# Patient Record
Sex: Female | Born: 1965 | Race: White | Hispanic: No | Marital: Married | Smoking: Former smoker
Health system: Southern US, Community
[De-identification: ages and names within clinical notes are randomized; demographics above are authoritative.]

## PROBLEM LIST (undated history)

## (undated) DIAGNOSIS — C4491 Basal cell carcinoma of skin, unspecified: Secondary | ICD-10-CM

## (undated) DIAGNOSIS — IMO0002 Reserved for concepts with insufficient information to code with codable children: Secondary | ICD-10-CM

## (undated) HISTORY — DX: Basal cell carcinoma of skin, unspecified: C44.91

## (undated) HISTORY — PX: KNEE SURGERY: SHX244

## (undated) HISTORY — PX: BASAL CELL CARCINOMA EXCISION: SHX1214

## (undated) HISTORY — PX: MOUTH SURGERY: SHX715

## (undated) HISTORY — DX: Reserved for concepts with insufficient information to code with codable children: IMO0002

---

## 1990-05-21 HISTORY — PX: DILATION AND CURETTAGE OF UTERUS: SHX78

## 1993-05-21 HISTORY — PX: DILATION AND CURETTAGE OF UTERUS: SHX78

## 2004-05-21 HISTORY — PX: REDUCTION MAMMAPLASTY: SUR839

## 2008-12-13 ENCOUNTER — Encounter: Admission: RE | Admit: 2008-12-13 | Discharge: 2008-12-13 | Payer: Self-pay | Admitting: Obstetrics and Gynecology

## 2009-12-14 ENCOUNTER — Encounter: Admission: RE | Admit: 2009-12-14 | Discharge: 2009-12-14 | Payer: Self-pay | Admitting: Obstetrics and Gynecology

## 2010-08-01 ENCOUNTER — Encounter: Payer: Self-pay | Admitting: Family Medicine

## 2010-08-01 ENCOUNTER — Ambulatory Visit (INDEPENDENT_AMBULATORY_CARE_PROVIDER_SITE_OTHER): Payer: BC Managed Care – PPO | Admitting: Family Medicine

## 2010-08-01 DIAGNOSIS — R269 Unspecified abnormalities of gait and mobility: Secondary | ICD-10-CM | POA: Insufficient documentation

## 2010-08-01 DIAGNOSIS — M25569 Pain in unspecified knee: Secondary | ICD-10-CM

## 2010-08-08 NOTE — Assessment & Plan Note (Signed)
SummaryErlene Cox [phone]   Vital Signs:  Patient profile:   45 year old female Height:      63 inches Weight:      125 pounds BMI:     22.22 BP sitting:   120 / 79  Vitals Entered By: Lillia Pauls CMA (August 01, 2010 3:27 PM)  CC:  L knee pain.  History of Present Illness: 45yo female to office c/o L knee pain x 4-days.  Started during run this weekend, denies any injury or trauma.  Did recently changes shoes the end of last week, also ran on pavement over the weekend & normally runs trails.  Pain along lateral aspect of knee & sharp in nature.  No associated swelling or mechanical symptoms.  No instability.  Worse going down hills.  Improved with rest.  Not taking any medications for the pain.  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  Currently training for 1/2 marathon scheduled for 09/16/10, running 20-35 miles/wk, typically running on trails.  Alternates between several different trails.  Had set of SuperFeet insoles in old running shoes, but no inserts in current shoes.  Review of Systems       per HPI, otherwise neg  Physical Exam  General:  Well-developed,well-nourished,in no acute distress; alert,appropriate and cooperative throughout examination Head:  normocephalic and atraumatic.   Eyes:  vision grossly intact.   Lungs:  normal respiratory effort.   Msk:  HIPS: FROM b/l without pain.  Normal hip strength in all planes.  KNEES: FROM b/l without pain.  No swelling or effusion.  No tenderness over joint lines, patella, PT, QT.  Slight TTP over ITB on left.  Neg Ober.  No ligamentous laxity.  Neg McMurray.  Neg patellar apprehension, neg patellar grind.  FEET/ANKLES: FROM ankles b/l without pain, weakness, or instability.  Callous noted along 2nd, 3rd, 4th MTs b/l.  Cavus foot while seated, longitudinal arch collapse with standing b/l, no transverse arch collapse.  GAIT: no leg length difference.  Over pronation b/l  with walking, more pronounced with running.  Also genu valgum while walking/running Pulses:  +2/4 DP & PT Neurologic:  sensation intact to light touch.     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  Try to run on flat/level surfaces - Reviewed ITB stretches - OTC NSAIDs as needed - f/u as needed  Orders: Garment,belt,sleeve or other covering ,elastic or similar stretch (U9811) Sports Insoles (B1478)  Problem # 2:  ABNORMALITY OF GAIT (ICD-781.2) - over prontation b/l - Fitted with sports insoles - these were comfortable to patient & gait more neutral.  Wear these in your running shoes  Orders: Sports Insoles (G9562)   Orders Added: 1)  New Patient Level II [99202] 2)  Garment,belt,sleeve or other covering ,elastic or similar stretch [A4466] 3)  Sports Insoles [L3510]

## 2010-08-09 ENCOUNTER — Other Ambulatory Visit (HOSPITAL_COMMUNITY)
Admission: RE | Admit: 2010-08-09 | Discharge: 2010-08-09 | Disposition: A | Discharge status: Home / Self Care | Payer: BC Managed Care – PPO | Source: Ambulatory Visit | Attending: Gynecology | Admitting: Gynecology

## 2010-08-09 ENCOUNTER — Other Ambulatory Visit: Payer: Self-pay | Admitting: Gynecology

## 2010-08-09 ENCOUNTER — Ambulatory Visit (INDEPENDENT_AMBULATORY_CARE_PROVIDER_SITE_OTHER): Payer: BC Managed Care – PPO | Admitting: Gynecology

## 2010-08-09 DIAGNOSIS — Z124 Encounter for screening for malignant neoplasm of cervix: Secondary | ICD-10-CM | POA: Insufficient documentation

## 2010-08-09 DIAGNOSIS — Z833 Family history of diabetes mellitus: Secondary | ICD-10-CM

## 2010-08-09 DIAGNOSIS — Z13220 Encounter for screening for lipoid disorders: Secondary | ICD-10-CM

## 2010-08-09 DIAGNOSIS — B373 Candidiasis of vulva and vagina: Secondary | ICD-10-CM

## 2010-08-09 DIAGNOSIS — Z01419 Encounter for gynecological examination (general) (routine) without abnormal findings: Secondary | ICD-10-CM

## 2010-09-04 ENCOUNTER — Other Ambulatory Visit: Payer: Self-pay | Admitting: Gynecology

## 2010-09-04 DIAGNOSIS — Z1231 Encounter for screening mammogram for malignant neoplasm of breast: Secondary | ICD-10-CM

## 2010-09-23 ENCOUNTER — Emergency Department (HOSPITAL_BASED_OUTPATIENT_CLINIC_OR_DEPARTMENT_OTHER)
Admission: EM | Admit: 2010-09-23 | Discharge: 2010-09-24 | Disposition: A | Discharge status: Home / Self Care | Payer: BC Managed Care – PPO | Attending: Emergency Medicine | Admitting: Emergency Medicine

## 2010-09-23 DIAGNOSIS — M7989 Other specified soft tissue disorders: Secondary | ICD-10-CM | POA: Insufficient documentation

## 2010-09-23 DIAGNOSIS — R209 Unspecified disturbances of skin sensation: Secondary | ICD-10-CM | POA: Insufficient documentation

## 2010-09-24 LAB — BASIC METABOLIC PANEL
BUN: 17 mg/dL (ref 6–23)
Creatinine, Ser: 0.7 mg/dL (ref 0.4–1.2)
Potassium: 3.4 mEq/L — ABNORMAL LOW (ref 3.5–5.1)

## 2010-09-24 LAB — CBC
HCT: 34.6 % — ABNORMAL LOW (ref 36.0–46.0)
MCV: 89.6 fL (ref 78.0–100.0)
RBC: 3.86 MIL/uL — ABNORMAL LOW (ref 3.87–5.11)
WBC: 6 10*3/uL (ref 4.0–10.5)

## 2010-09-24 LAB — DIFFERENTIAL
Basophils Absolute: 0 10*3/uL (ref 0.0–0.1)
Eosinophils Relative: 2 % (ref 0–5)
Monocytes Absolute: 0.8 10*3/uL (ref 0.1–1.0)
Neutro Abs: 2.5 10*3/uL (ref 1.7–7.7)
Neutrophils Relative %: 42 % — ABNORMAL LOW (ref 43–77)

## 2010-12-28 ENCOUNTER — Ambulatory Visit
Admission: RE | Admit: 2010-12-28 | Discharge: 2010-12-28 | Disposition: A | Discharge status: Home / Self Care | Payer: BC Managed Care – PPO | Source: Ambulatory Visit | Attending: Gynecology | Admitting: Gynecology

## 2010-12-28 DIAGNOSIS — Z1231 Encounter for screening mammogram for malignant neoplasm of breast: Secondary | ICD-10-CM

## 2011-08-09 ENCOUNTER — Encounter: Payer: Self-pay | Admitting: *Deleted

## 2011-08-10 ENCOUNTER — Encounter: Payer: BC Managed Care – PPO | Admitting: Gynecology

## 2011-10-18 ENCOUNTER — Ambulatory Visit (INDEPENDENT_AMBULATORY_CARE_PROVIDER_SITE_OTHER): Payer: BC Managed Care – PPO | Admitting: Gynecology

## 2011-10-18 ENCOUNTER — Encounter: Payer: Self-pay | Admitting: Gynecology

## 2011-10-18 VITALS — BP 102/70 | Ht 64.0 in | Wt 119.0 lb

## 2011-10-18 DIAGNOSIS — Z01419 Encounter for gynecological examination (general) (routine) without abnormal findings: Secondary | ICD-10-CM

## 2011-10-18 DIAGNOSIS — R634 Abnormal weight loss: Secondary | ICD-10-CM

## 2011-10-18 DIAGNOSIS — N898 Other specified noninflammatory disorders of vagina: Secondary | ICD-10-CM

## 2011-10-18 LAB — COMPREHENSIVE METABOLIC PANEL
ALT: 12 U/L (ref 0–35)
AST: 18 U/L (ref 0–37)
Calcium: 9.2 mg/dL (ref 8.4–10.5)
Chloride: 103 mEq/L (ref 96–112)
Creat: 0.76 mg/dL (ref 0.50–1.10)
Sodium: 138 mEq/L (ref 135–145)
Total Protein: 6.8 g/dL (ref 6.0–8.3)

## 2011-10-18 LAB — WET PREP FOR TRICH, YEAST, CLUE
Trich, Wet Prep: NONE SEEN
Yeast Wet Prep HPF POC: NONE SEEN

## 2011-10-18 MED ORDER — FLUCONAZOLE 200 MG PO TABS: 200.0000 mg | ORAL_TABLET | Freq: Every day | ORAL | Status: AC

## 2011-10-18 NOTE — Progress Notes (Signed)
Whitney Cox February 19, 1966 [id number]        46 y.o.  for annual exam.  Several issues noted below.  Past medical history,surgical history, medications, allergies, family history and social history were all reviewed and documented in the EPIC chart. ROS:  Was performed and pertinent positives and negatives are included in the history.  Exam: sherri chaparone present Filed Vitals:   10/18/11 1143  BP: 102/70   General appearance  Normal Skin grossly normal Head/Neck normal with no cervical or supraclavicular adenopathy thyroid normal Lungs  clear Cardiac RR, without RMG Abdominal  soft, nontender, without masses, organomegaly or hernia Breasts  examined lying and sitting without masses, retractions, discharge or axillary adenopathy.  Bilateral well-healed reduction scars noted Pelvic  Ext/BUS/vagina  normal slight white discharge, wet prep done  Cervix  normal   Uterus  introverted, normal size, shape and contour, midline and mobile nontender   Adnexa  Without masses or tenderness    Anus and perineum  normal   Rectovaginal  normal sphincter tone without palpated masses or tenderness.    Assessment/Plan:  46 y.o. female for annual exam, regular menses, vasectomy birth control.    1. Weight loss. Patient reports in the last 2 years weight loss from her normal 125 to her current 119. She actually had weighed more than this before moving and then lost to the 119. She does run a lot on a daily basis and has attributed her weight loss to this but is little concerned about her weight loss. She has no hair changes, skin changes, fatigue or other symptoms. Recently saw her primary who did a CBC which showed anemia at 11.8 hemoglobin which I asked her to start an iron supplement. Also a potassium level of 3.4. We'll go ahead and check comprehensive metabolic panel TSH, ANA and HIV. Assuming labs are normal she'll monitor her weight, if she continues to lose then she'll follow up with her primary  physician for further evaluation, if she stabilizes and will monitor. 2. Vaginal discharge. Recently treated by primary for yeast with Diflucan 150 mg. Still notes some itching. Exam shows slight white discharge. KOH wet prep unimpressive. We'll treat with Diflucan 200 daily x3 days as a partially treated yeast and see if this doesn't resolve her symptoms. She will call if it persists. 3. Mammography. Patients due for mammogram in August. She does have a family history of breast cancer which I reviewed last year with her. I again reoffered genetic counseling and if she wants to pursue it she'll let me know to consider genetic testing. I did recommend considering a 3-D mammography this August when she is due for her mammogram and she will schedule this with the breast center. SBE monthly reviewed. 4. Contraception. Patient's husband had a vasectomy. 5. Pap smear. Patient has no history of abnormal Pap smears with last normal 2012. I did not do a Pap smear this year and per current screening guidelineswill plan on a less frequent screening interval at 3-5 years. 6. Health maintenance. Will check above blood work. Lipid profile last year was normal and I did not repeat this. Assuming she continues well from a gynecologic standpoint and she will see me in a year, sooner as needed.     Dara Lords MD, 12:34 PM 10/18/2011

## 2011-10-18 NOTE — Patient Instructions (Signed)
Follow up for blood results. Otherwise follow up in one year for annual gynecologic exam. Weight loss continues notify me and we will have your primary physician to evaluate you. Schedule your 3-D mammogram in August when due. Let me know if there is any problems arranging this.

## 2011-10-19 LAB — URINALYSIS W MICROSCOPIC + REFLEX CULTURE
Bacteria, UA: NONE SEEN
Glucose, UA: NEGATIVE mg/dL
Hgb urine dipstick: NEGATIVE
Nitrite: NEGATIVE
Specific Gravity, Urine: 1.014 (ref 1.005–1.030)
Squamous Epithelial / LPF: NONE SEEN
Urobilinogen, UA: 0.2 mg/dL (ref 0.0–1.0)
pH: 7 (ref 5.0–8.0)

## 2011-10-19 LAB — ANA: Anti Nuclear Antibody(ANA): NEGATIVE

## 2011-10-23 ENCOUNTER — Emergency Department (HOSPITAL_BASED_OUTPATIENT_CLINIC_OR_DEPARTMENT_OTHER)
Admission: EM | Admit: 2011-10-23 | Discharge: 2011-10-24 | Disposition: A | Discharge status: Home / Self Care | Payer: BC Managed Care – PPO | Attending: Emergency Medicine | Admitting: Emergency Medicine

## 2011-10-23 ENCOUNTER — Encounter (HOSPITAL_BASED_OUTPATIENT_CLINIC_OR_DEPARTMENT_OTHER): Payer: Self-pay | Admitting: *Deleted

## 2011-10-23 DIAGNOSIS — R300 Dysuria: Secondary | ICD-10-CM | POA: Insufficient documentation

## 2011-10-23 DIAGNOSIS — N390 Urinary tract infection, site not specified: Secondary | ICD-10-CM

## 2011-10-23 DIAGNOSIS — Z87891 Personal history of nicotine dependence: Secondary | ICD-10-CM | POA: Insufficient documentation

## 2011-10-23 DIAGNOSIS — Z87440 Personal history of urinary (tract) infections: Secondary | ICD-10-CM | POA: Insufficient documentation

## 2011-10-23 LAB — PREGNANCY, URINE: Preg Test, Ur: NEGATIVE

## 2011-10-23 NOTE — ED Notes (Signed)
Pt c/o " uti" x 1 week

## 2011-10-24 ENCOUNTER — Other Ambulatory Visit: Payer: Self-pay | Admitting: Gynecology

## 2011-10-24 DIAGNOSIS — Z1231 Encounter for screening mammogram for malignant neoplasm of breast: Secondary | ICD-10-CM

## 2011-10-24 LAB — URINALYSIS, ROUTINE W REFLEX MICROSCOPIC
Bilirubin Urine: NEGATIVE
Ketones, ur: NEGATIVE mg/dL
Specific Gravity, Urine: 1.004 — ABNORMAL LOW (ref 1.005–1.030)

## 2011-10-24 MED ORDER — NITROFURANTOIN MONOHYD MACRO 100 MG PO CAPS
100.0000 mg | ORAL_CAPSULE | Freq: Once | ORAL | Status: AC
  Administered 2011-10-24: 100 mg via ORAL
  Filled 2011-10-24: qty 1

## 2011-10-24 MED ORDER — NITROFURANTOIN MONOHYD MACRO 100 MG PO CAPS: 100.0000 mg | ORAL_CAPSULE | Freq: Two times a day (BID) | ORAL | Status: AC

## 2011-10-24 MED ORDER — PHENAZOPYRIDINE HCL 100 MG PO TABS
95.0000 mg | ORAL_TABLET | Freq: Once | ORAL | Status: AC
  Administered 2011-10-24: 100 mg via ORAL
  Filled 2011-10-24: qty 1

## 2011-10-24 MED ORDER — PHENAZOPYRIDINE HCL 200 MG PO TABS: 200.0000 mg | ORAL_TABLET | Freq: Three times a day (TID) | ORAL | Status: AC

## 2011-10-24 NOTE — Discharge Instructions (Signed)
Urinary Tract Infection Infections of the urinary tract can start in several places. A bladder infection (cystitis), a kidney infection (pyelonephritis), and a prostate infection (prostatitis) are different types of urinary tract infections (UTIs). They usually get better if treated with medicines (antibiotics) that kill germs. Take all the medicine until it is gone. You or your child may feel better in a few days, but TAKE ALL MEDICINE or the infection may not respond and may become more difficult to treat. HOME CARE INSTRUCTIONS   Drink enough water and fluids to keep the urine clear or pale yellow. Cranberry juice is especially recommended, in addition to large amounts of water.   Avoid caffeine, tea, and carbonated beverages. They tend to irritate the bladder.   Alcohol may irritate the prostate.   Only take over-the-counter or prescription medicines for pain, discomfort, or fever as directed by your caregiver.  To prevent further infections:  Empty the bladder often. Avoid holding urine for long periods of time.   After a bowel movement, women should cleanse from front to back. Use each tissue only once.   Empty the bladder before and after sexual intercourse.   SEEK IMMEDIATE MEDICAL CARE IF:   There is severe back pain or lower abdominal pain.   You or your child develops chills.   You have a fever.   There is nausea or vomiting.   There is continued burning or discomfort with urination.  MAKE SURE YOU:   Understand these instructions.   Will watch your condition.   Will get help right away if you are not doing well or get worse.  Document Released: 02/14/2005 Document Revised: 04/26/2011 Document Reviewed: 09/19/2006 [facility] Patient Information 2012 [employer], [place].

## 2011-10-24 NOTE — ED Notes (Addendum)
MD at bedside. 

## 2011-10-24 NOTE — ED Provider Notes (Signed)
History     CSN: [id number]  Arrival date & time 10/23/11  2343   First MD Initiated Contact with Patient 10/24/11 0010      Chief Complaint  Patient presents with  . Dysuria    (Consider location/radiation/quality/duration/timing/severity/associated sxs/prior treatment) HPI This is a 46 year old white female with a history of multiple urinary tract infections in the past. She was recently seen by her OB/GYN and treated with Diflucan for yeast infection. She has had urinary urgency and a sensation of a full bladder for the past 3 days which worsened over the last 24 hours. She states she feels like her bladder wants to come out of her abdomen. The symptoms are moderate to severe. She denies fever, chills, nausea or vomiting.  History reviewed. No pertinent past medical history.  Past Surgical History  Procedure Date  . Dilation and curettage of uterus 1992  . Dilation and curettage of uterus 1995  . Reduction mammaplasty 2006    Family History  Problem Relation Age of Onset  . Breast cancer Mother 48    History  Substance Use Topics  . Smoking status: Former Games developer  . Smokeless tobacco: Never Used  . Alcohol Use: 4.2 oz/week    7 Glasses of wine per week    OB History    Grav Para Term Preterm Abortions TAB SAB Ect Mult Living   5         2      Review of Systems  All other systems reviewed and are negative.    Allergies  Ciprofloxacin hcl  Home Medications   Current Outpatient Rx  Name Route Sig Dispense Refill  . FLUCONAZOLE 200 MG PO TABS Oral Take 1 tablet (200 mg total) by mouth daily. 3 tablet 0  . JUICE PLUS FIBRE PO Oral Take by mouth.      BP 121/77  Pulse 63  Temp(Src) 98.9 F (37.2 C) (Oral)  Resp 16  Ht 5\' 4"  (1.626 m)  Wt 120 lb (54.432 kg)  BMI 20.60 kg/m2  SpO2 100%  LMP 10/02/2011  Physical Exam General: Well-developed, well-nourished female in no acute distress; appearance consistent with age of record HENT: normocephalic,  atraumatic Eyes: pupils equal round and reactive to light; extraocular muscles intact Neck: supple Heart: regular rate and rhythm Lungs: clear to auscultation bilaterally Abdomen: soft; nondistended; suprapubic tenderness; bowel sounds present Extremities: No deformity; full range of motion; pulses normal Neurologic: Awake, alert and oriented; motor function intact in all extremities and symmetric; no facial droop Skin: Warm and dry Psychiatric: Normal mood and affect    ED Course  Procedures (including critical care time)     MDM   Nursing notes and vitals signs, including pulse oximetry, reviewed.  Summary of this visit's results, reviewed by myself:  Labs:  Results for orders placed during the hospital encounter of 10/23/11  URINALYSIS, ROUTINE W REFLEX MICROSCOPIC      Component Value Range   Color, Urine YELLOW  YELLOW    APPearance CLEAR  CLEAR    Specific Gravity, Urine 1.004 (*) 1.005 - 1.030    pH 5.5  5.0 - 8.0    Glucose, UA NEGATIVE  NEGATIVE (mg/dL)   Hgb urine dipstick TRACE (*) NEGATIVE    Bilirubin Urine NEGATIVE  NEGATIVE    Ketones, ur NEGATIVE  NEGATIVE (mg/dL)   Protein, ur NEGATIVE  NEGATIVE (mg/dL)   Urobilinogen, UA 0.2  0.0 - 1.0 (mg/dL)   Nitrite NEGATIVE  NEGATIVE  Leukocytes, UA LARGE (*) NEGATIVE   PREGNANCY, URINE      Component Value Range   Preg Test, Ur NEGATIVE  NEGATIVE   URINE MICROSCOPIC-ADD ON      Component Value Range   Squamous Epithelial / LPF RARE  RARE    WBC, UA 21-50  <3 (WBC/hpf)   Bacteria, UA FEW (*) RARE     Imaging Studies: No results found.          Hanley Seamen, MD 10/24/11 0020

## 2011-10-24 NOTE — ED Notes (Signed)
MD at bedside. 

## 2011-10-25 LAB — URINE CULTURE: Culture  Setup Time: 201306050621

## 2012-01-01 ENCOUNTER — Ambulatory Visit
Admission: RE | Admit: 2012-01-01 | Discharge: 2012-01-01 | Disposition: A | Discharge status: Home / Self Care | Payer: BC Managed Care – PPO | Source: Ambulatory Visit | Attending: Gynecology | Admitting: Gynecology

## 2012-01-01 DIAGNOSIS — Z1231 Encounter for screening mammogram for malignant neoplasm of breast: Secondary | ICD-10-CM

## 2012-08-07 ENCOUNTER — Other Ambulatory Visit: Payer: Self-pay | Admitting: Dermatology

## 2012-11-06 ENCOUNTER — Other Ambulatory Visit (HOSPITAL_COMMUNITY)
Admission: RE | Admit: 2012-11-06 | Discharge: 2012-11-06 | Disposition: A | Discharge status: Home / Self Care | Payer: BC Managed Care – PPO | Source: Ambulatory Visit | Attending: Gynecology | Admitting: Gynecology

## 2012-11-06 ENCOUNTER — Encounter: Payer: Self-pay | Admitting: Gynecology

## 2012-11-06 ENCOUNTER — Ambulatory Visit (INDEPENDENT_AMBULATORY_CARE_PROVIDER_SITE_OTHER): Payer: BC Managed Care – PPO | Admitting: Gynecology

## 2012-11-06 VITALS — BP 120/74 | Ht 64.0 in | Wt 121.0 lb

## 2012-11-06 DIAGNOSIS — Z01419 Encounter for gynecological examination (general) (routine) without abnormal findings: Secondary | ICD-10-CM

## 2012-11-06 DIAGNOSIS — N898 Other specified noninflammatory disorders of vagina: Secondary | ICD-10-CM

## 2012-11-06 DIAGNOSIS — L293 Anogenital pruritus, unspecified: Secondary | ICD-10-CM

## 2012-11-06 LAB — WET PREP FOR TRICH, YEAST, CLUE: Yeast Wet Prep HPF POC: NONE SEEN

## 2012-11-06 MED ORDER — FLUCONAZOLE 150 MG PO TABS: 150.0000 mg | ORAL_TABLET | Freq: Once | ORAL | Status: DC

## 2012-11-06 NOTE — Progress Notes (Signed)
Whitney Cox 1966-03-13 [id number]        47 y.o.  [id number] for annual exam.  Several issues below.  Past medical history,surgical history, medications, allergies, family history and social history were all reviewed and documented in the EPIC chart.  ROS:  Performed and pertinent positives and negatives are included in the history, assessment and plan .  Exam: [name] assistant Filed Vitals:   11/06/12 1355  BP: 120/74  Height: 5\' 4"  (1.626 m)  Weight: 121 lb (54.885 kg)   General appearance  Normal Skin grossly normal Head/Neck normal with no cervical or supraclavicular adenopathy thyroid normal Lungs  clear Cardiac RR, without RMG Abdominal  soft, nontender, without masses, organomegaly or hernia Breasts  examined lying and sitting without masses, retractions, discharge or axillary adenopathy. Bilateral reduction scars noted. Pelvic  Ext/BUS/vagina  normal with slight white discharge  Cervix  normal   Uterus  anteverted, normal size, shape and contour, midline and mobile nontender   Adnexa  Without masses or tenderness    Anus and perineum  normal   Rectovaginal  normal sphincter tone without palpated masses or tenderness.    Assessment/Plan:  47 y.o. [id number] female for annual exam, regular menses, vasectomy birth control.   1. Vaginal pruritus. Patient on antibiotics for dental work with vaginal itching and slight discharge. Wet prep is negative but certainly clinically suspicious for yeast. Will cover with Diflucan 150 mg. #4 was provided to use when necessary and she is to be on antibiotics for some time. 2. Pap smear 2012. No history of significant abnormal Pap smears previously. Patient requested Pap smear this year and she is uncomfortable spacing it further apart than every other year at this point. Pap was done today. 3. 3-D mammography 12/2011. Continue with annual 3-D mammography. SBE monthly reviewed. She does have dense breasts we previously discussed her family history as  far as whether genetic testing should be done. I again reviewed this today. Her mother is age 46 and apparently had another aunt or so on that side but the history is sparse. Options to discuss with genetic counselor and have them decide as far as genetic testing was offered but declined. At this point she can follow with annual mammography. 4. Strong family history of osteoporosis. She is athletic and premenopausal. Recommend baseline DEXA when she goes through menopause. Increase calcium vitamin D reviewed now. 5. Health maintenance. Was concerned last year with weight loss. Her laboratory turned normal and her weight has been stable since then. Plans on seeing her primary physician for general health maintenance and lab work and declines lab work here. Followup one year, sooner as needed   Dara Lords MD, 2:37 PM 11/06/2012

## 2012-11-06 NOTE — Patient Instructions (Signed)
Used Diflucan as needed for vaginal itching. Followup in one year for annual exam, sooner as needed.

## 2012-11-07 LAB — URINALYSIS W MICROSCOPIC + REFLEX CULTURE
Hgb urine dipstick: NEGATIVE
Ketones, ur: 15 mg/dL — AB
Leukocytes, UA: NEGATIVE
Protein, ur: NEGATIVE mg/dL
Urobilinogen, UA: 0.2 mg/dL (ref 0.0–1.0)
pH: 5.5 (ref 5.0–8.0)

## 2012-11-08 LAB — URINE CULTURE: Colony Count: NO GROWTH

## 2012-12-16 ENCOUNTER — Other Ambulatory Visit: Payer: Self-pay

## 2012-12-16 DIAGNOSIS — Z1231 Encounter for screening mammogram for malignant neoplasm of breast: Secondary | ICD-10-CM

## 2013-01-01 ENCOUNTER — Ambulatory Visit (INDEPENDENT_AMBULATORY_CARE_PROVIDER_SITE_OTHER): Payer: BC Managed Care – PPO | Admitting: Sports Medicine

## 2013-01-01 ENCOUNTER — Encounter: Payer: Self-pay | Admitting: Sports Medicine

## 2013-01-01 VITALS — BP 119/77 | HR 57 | Ht 64.0 in | Wt 121.0 lb

## 2013-01-01 DIAGNOSIS — M7918 Myalgia, other site: Secondary | ICD-10-CM

## 2013-01-01 DIAGNOSIS — IMO0001 Reserved for inherently not codable concepts without codable children: Secondary | ICD-10-CM

## 2013-01-01 NOTE — Assessment & Plan Note (Addendum)
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 She's been giving stretching exercises as well as advised to use a tennis ball for massage. She may continue her current running regimen. She'll followup in 4-6 weeks' time for reevaluation  Heel lifts on insoles for cushion  Note she does favor the left leg and hip slightly with running  Injection is an option if this persists inspite of exercise and stretch

## 2013-01-01 NOTE — Progress Notes (Signed)
Patient ID: Whitney Cox, female   DOB: 1965/10/04, 47 y.o.   MRN: [id number] This is a 47 year old female avid trail runner who presents to the sports medicine clinic with a complaint of left hip pain. She first noted the pain after a fall she sustained in a tough mudder trail run in November of 2013. Her pain has continued in the left gluteal region since that time. The pain is worse when she runs. She also notes that with certain activities like climbing stairs causes pain. She has not taken anything for the pain. She has not cut back on running and states that she has chosen to run through the pain.  Past Medical History  Diagnosis Date  . Basal cell cancer    Past Surgical History  Procedure Laterality Date  . Dilation and curettage of uterus  1992  . Dilation and curettage of uterus  1995  . Reduction mammaplasty  2006  . Mouth surgery    . Basal cell carcinoma excision     Allergies  Allergen Reactions  . Ciprofloxacin Hcl     Review of systems as per history of present illness otherwise negative.  Examination: BP 119/77  Pulse 57  Ht 5\' 4"  (1.626 m)  Wt 121 lb (54.885 kg)  BMI 20.76 kg/m2  This is a well-developed well-nourished 47 year old female awake alert oriented in no acute distress.   Left Hip: ROM IR: 45 Deg, ER:45  Deg, Flexion: 120 Deg, Extension: 15 Deg, Abduction: 45 Deg, Adduction: 30 Deg Strength IR: 5/5, ER: 5/5, Flexion: 5/5, Extension: 5/5, Abduction: 5/5, Adduction: 5/5 Pelvic alignment unremarkable to inspection and palpation. Standing hip rotation and gait without trendelenburg / unsteadiness. Greater trochanter without tenderness to palpation. No tenderness over piriformis and greater trochanter. No SI joint tenderness and normal minimal SI movement.  Musculoskeletal ultrasound was performed of the left posterior hip. Structures imaged the gluteus medius muscle, the gluteus maximus muscle and the piriformis muscle. Examination of the piriformis  muscle revealed fibrotic changes consistent with past hematoma and with regions of calcium deposits as well.  On scan it appears that the sciatic nerve passes through the piriformis mm in area of fibrotic change

## 2013-01-21 ENCOUNTER — Ambulatory Visit
Admission: RE | Admit: 2013-01-21 | Discharge: 2013-01-21 | Disposition: A | Discharge status: Home / Self Care | Payer: BC Managed Care – PPO | Source: Ambulatory Visit

## 2013-01-21 DIAGNOSIS — Z1231 Encounter for screening mammogram for malignant neoplasm of breast: Secondary | ICD-10-CM

## 2013-01-23 ENCOUNTER — Other Ambulatory Visit: Payer: Self-pay | Admitting: Gynecology

## 2013-01-23 DIAGNOSIS — R928 Other abnormal and inconclusive findings on diagnostic imaging of breast: Secondary | ICD-10-CM

## 2013-01-27 ENCOUNTER — Ambulatory Visit
Admission: RE | Admit: 2013-01-27 | Discharge: 2013-01-27 | Disposition: A | Discharge status: Home / Self Care | Payer: BC Managed Care – PPO | Source: Ambulatory Visit | Attending: Gynecology | Admitting: Gynecology

## 2013-01-27 DIAGNOSIS — R928 Other abnormal and inconclusive findings on diagnostic imaging of breast: Secondary | ICD-10-CM

## 2013-02-11 ENCOUNTER — Ambulatory Visit: Payer: BC Managed Care – PPO | Admitting: Sports Medicine

## 2013-09-15 ENCOUNTER — Other Ambulatory Visit: Payer: Self-pay | Admitting: Dermatology

## 2013-11-02 ENCOUNTER — Other Ambulatory Visit: Payer: Self-pay

## 2013-11-02 DIAGNOSIS — Z1231 Encounter for screening mammogram for malignant neoplasm of breast: Secondary | ICD-10-CM

## 2013-11-09 ENCOUNTER — Encounter: Payer: Self-pay | Admitting: Gynecology

## 2013-11-09 ENCOUNTER — Ambulatory Visit (INDEPENDENT_AMBULATORY_CARE_PROVIDER_SITE_OTHER): Payer: BC Managed Care – PPO | Admitting: Gynecology

## 2013-11-09 VITALS — BP 120/70 | Ht 64.0 in | Wt 121.0 lb

## 2013-11-09 DIAGNOSIS — Z01419 Encounter for gynecological examination (general) (routine) without abnormal findings: Secondary | ICD-10-CM

## 2013-11-09 DIAGNOSIS — N63 Unspecified lump in unspecified breast: Secondary | ICD-10-CM

## 2013-11-09 LAB — CBC WITH DIFFERENTIAL/PLATELET
BASOS PCT: 0 % (ref 0–1)
Basophils Absolute: 0 10*3/uL (ref 0.0–0.1)
EOS ABS: 0 10*3/uL (ref 0.0–0.7)
EOS PCT: 0 % (ref 0–5)
HCT: 36.8 % (ref 36.0–46.0)
Hemoglobin: 12.4 g/dL (ref 12.0–15.0)
LYMPHS ABS: 1.9 10*3/uL (ref 0.7–4.0)
Lymphocytes Relative: 36 % (ref 12–46)
MCH: 30.6 pg (ref 26.0–34.0)
MCHC: 33.7 g/dL (ref 30.0–36.0)
MCV: 90.9 fL (ref 78.0–100.0)
Monocytes Absolute: 0.5 10*3/uL (ref 0.1–1.0)
Monocytes Relative: 9 % (ref 3–12)
Neutro Abs: 2.9 10*3/uL (ref 1.7–7.7)
Neutrophils Relative %: 55 % (ref 43–77)
PLATELETS: 220 10*3/uL (ref 150–400)
RBC: 4.05 MIL/uL (ref 3.87–5.11)
RDW: 13.5 % (ref 11.5–15.5)
WBC: 5.2 10*3/uL (ref 4.0–10.5)

## 2013-11-09 LAB — COMPREHENSIVE METABOLIC PANEL
ALK PHOS: 45 U/L (ref 39–117)
ALT: 10 U/L (ref 0–35)
AST: 20 U/L (ref 0–37)
Albumin: 4.4 g/dL (ref 3.5–5.2)
BILIRUBIN TOTAL: 0.6 mg/dL (ref 0.2–1.2)
BUN: 15 mg/dL (ref 6–23)
CO2: 27 meq/L (ref 19–32)
CREATININE: 0.79 mg/dL (ref 0.50–1.10)
Calcium: 9.5 mg/dL (ref 8.4–10.5)
Chloride: 102 mEq/L (ref 96–112)
GLUCOSE: 78 mg/dL (ref 70–99)
Potassium: 3.6 mEq/L (ref 3.5–5.3)
Sodium: 139 mEq/L (ref 135–145)
Total Protein: 6.9 g/dL (ref 6.0–8.3)

## 2013-11-09 LAB — LIPID PANEL
CHOL/HDL RATIO: 1.9 ratio
CHOLESTEROL: 168 mg/dL (ref 0–200)
HDL: 90 mg/dL (ref >39)
LDL Cholesterol: 71 mg/dL (ref 0–99)
TRIGLYCERIDES: 36 mg/dL (ref <150)
VLDL: 7 mg/dL (ref 0–40)

## 2013-11-09 NOTE — Progress Notes (Addendum)
Whitney Cox May 19, 1966 [id number]        48 y.o.  [id number] for annual exam.  Several issues noted below.  Past medical history,surgical history, problem list, medications, allergies, family history and social history were all reviewed and documented as reviewed in the EPIC chart.  ROS:  12 system ROS performed with pertinent positives and negatives included in the history, assessment and plan.  Included Systems: General, HEENT, Neck, Cardiovascular, Pulmonary, Gastrointestinal, Genitourinary, Musculoskeletal, Dermatologic, Endocrine, Hematological, Neurologic, Psychiatric Additional significant findings :  None   Exam: Whitney Cox assistant Filed Vitals:   11/09/13 1436  BP: 120/70  Height: 5\' 4"  (1.626 m)  Weight: 121 lb (54.885 kg)   General appearance:  Normal affect, orientation and appearance. Skin: Grossly normal HEENT: Without gross lesions.  No cervical or supraclavicular adenopathy. Thyroid normal.  Lungs:  Clear without wheezing, rales or rhonchi Cardiac: RR, without RMG Abdominal:  Soft, nontender, without masses, guarding, rebound, organomegaly or hernia Breasts:  Examined lying and sitting.  Right without masses, retractions, discharge or axillary adenopathy.  Left with firm 2 cm nodule 7:00 position off the areola.  No skin changes, discharge, axillary adenopathy.  Procedure:  Attempted aspiration of the nodule after alcohol cleanse, 1% lidocaine infiltration without return.  Pelvic:  Ext/BUS/vagina normal  Cervix normal  Uterus anteverted, normal size, shape and contour, midline and mobile nontender   Adnexa  Without masses or tenderness    Anus and perineum  Normal   Rectovaginal  Normal sphincter tone without palpated masses or tenderness.    Assessment/Plan:  48 y.o. [id number] female for annual exam with regular light menses, vasectomy birth control.   1. Left breast mass. Patient notes since this last fall she's noticed nodularity in her left breast around the nipple  at causes her discomfort. Had mammogram that showed questionable mass and followup ultrasound September 2014 which showed a cyst 7:30 position 2 cm above the areola. Exam today shows firm 2 cm nodule 7:00 position of the areola. Attempted aspiration as above without returned with firm feeling tissue. Recommend diagnostic mammography/ultrasound of this area. Consider excision regardless. Patient will followup with me after having the studies to triage based upon these results. She knows the importance of followup and to expect a call from my office to arrange the mammogram/ultrasound. We have discussed genetic counseling due to her family history of breast cancer but she has declined this. 2. Pap smear 10/2012. No Pap smear done today. No history of abnormal Pap smears previously. Plan repeat at 3 year interval per current screening guidelines. 3. Patient again asked about bone density. Does have a family history of osteoporosis. She is premenopausal, exercises on a regular basis and will plan on baseline DEXA at age 48 to 48. Increase calcium vitamin D reviewed. 4. Health maintenance baseline CBC comprehensive metabolic panel lipid profile urinalysis ordered. Followup for ultrasound/mammogram otherwise annually.    Note: This document was prepared with digital dictation and possible smart phrase technology. Any transcriptional errors that result from this process are unintentional.   Whitney Auerbach MD, 3:17 PM 11/09/2013

## 2013-11-09 NOTE — Patient Instructions (Signed)
Office will call you to arrange mammogram/ultrasound. Followup in one year for annual exam, sooner if any issues.  You may obtain a copy of any labs that were done today by logging onto MyChart as outlined in the instructions provided with your AVS (after visit summary). The office will not call with normal lab results but certainly if there are any significant abnormalities then we will contact you.   Health Maintenance, Female A healthy lifestyle and preventative care can promote health and wellness.  Maintain regular health, dental, and eye exams.  Eat a healthy diet. Foods like vegetables, fruits, whole grains, low-fat dairy products, and lean protein foods contain the nutrients you need without too many calories. Decrease your intake of foods high in solid fats, added sugars, and salt. Get information about a proper diet from your caregiver, if necessary.  Regular physical exercise is one of the most important things you can do for your health. Most adults should get at least 150 minutes of moderate-intensity exercise (any activity that increases your heart rate and causes you to sweat) each week. In addition, most adults need muscle-strengthening exercises on 2 or more days a week.   Maintain a healthy weight. The body mass index (BMI) is a screening tool to identify possible weight problems. It provides an estimate of body fat based on height and weight. Your caregiver can help determine your BMI, and can help you achieve or maintain a healthy weight. For adults 20 years and older:  A BMI below 18.5 is considered underweight.  A BMI of 18.5 to 24.9 is normal.  A BMI of 25 to 29.9 is considered overweight.  A BMI of 30 and above is considered obese.  Maintain normal blood lipids and cholesterol by exercising and minimizing your intake of saturated fat. Eat a balanced diet with plenty of fruits and vegetables. Blood tests for lipids and cholesterol should begin at age 85 and be repeated  every 5 years. If your lipid or cholesterol levels are high, you are over 50, or you are a high risk for heart disease, you may need your cholesterol levels checked more frequently.Ongoing high lipid and cholesterol levels should be treated with medicines if diet and exercise are not effective.  If you smoke, find out from your caregiver how to quit. If you do not use tobacco, do not start.  Lung cancer screening is recommended for adults aged 38 80 years who are at high risk for developing lung cancer because of a history of smoking. Yearly low-dose computed tomography (CT) is recommended for people who have at least a 30-pack-year history of smoking and are a current smoker or have quit within the past 15 years. A pack year of smoking is smoking an average of 1 pack of cigarettes a day for 1 year (for example: 1 pack a day for 30 years or 2 packs a day for 15 years). Yearly screening should continue until the smoker has stopped smoking for at least 15 years. Yearly screening should also be stopped for people who develop a health problem that would prevent them from having lung cancer treatment.  If you are pregnant, do not drink alcohol. If you are breastfeeding, be very cautious about drinking alcohol. If you are not pregnant and choose to drink alcohol, do not exceed 1 drink per day. One drink is considered to be 12 ounces (355 mL) of beer, 5 ounces (148 mL) of wine, or 1.5 ounces (44 mL) of liquor.  Avoid use of  street drugs. Do not share needles with anyone. Ask for help if you need support or instructions about stopping the use of drugs.  High blood pressure causes heart disease and increases the risk of stroke. Blood pressure should be checked at least every 1 to 2 years. Ongoing high blood pressure should be treated with medicines, if weight loss and exercise are not effective.  If you are 14 to 49 years old, ask your caregiver if you should take aspirin to prevent strokes.  Diabetes  screening involves taking a blood sample to check your fasting blood sugar level. This should be done once every 3 years, after age 55, if you are within normal weight and without risk factors for diabetes. Testing should be considered at a younger age or be carried out more frequently if you are overweight and have at least 1 risk factor for diabetes.  Breast cancer screening is essential preventative care for women. You should practice "breast self-awareness." This means understanding the normal appearance and feel of your breasts and may include breast self-examination. Any changes detected, no matter how small, should be reported to a caregiver. Women in their 56s and 30s should have a clinical breast exam (CBE) by a caregiver as part of a regular health exam every 1 to 3 years. After age 76, women should have a CBE every year. Starting at age 73, women should consider having a mammogram (breast X-ray) every year. Women who have a family history of breast cancer should talk to their caregiver about genetic screening. Women at a high risk of breast cancer should talk to their caregiver about having an MRI and a mammogram every year.  Breast cancer gene (BRCA)-related cancer risk assessment is recommended for women who have family members with BRCA-related cancers. BRCA-related cancers include breast, ovarian, tubal, and peritoneal cancers. Having family members with these cancers may be associated with an increased risk for harmful changes (mutations) in the breast cancer genes BRCA1 and BRCA2. Results of the assessment will determine the need for genetic counseling and BRCA1 and BRCA2 testing.  The Pap test is a screening test for cervical cancer. Women should have a Pap test starting at age 56. Between ages 77 and 38, Pap tests should be repeated every 2 years. Beginning at age 45, you should have a Pap test every 3 years as long as the past 3 Pap tests have been normal. If you had a hysterectomy for a  problem that was not cancer or a condition that could lead to cancer, then you no longer need Pap tests. If you are between ages 52 and 17, and you have had normal Pap tests going back 10 years, you no longer need Pap tests. If you have had past treatment for cervical cancer or a condition that could lead to cancer, you need Pap tests and screening for cancer for at least 20 years after your treatment. If Pap tests have been discontinued, risk factors (such as a new sexual partner) need to be reassessed to determine if screening should be resumed. Some women have medical problems that increase the chance of getting cervical cancer. In these cases, your caregiver may recommend more frequent screening and Pap tests.  The human papillomavirus (HPV) test is an additional test that may be used for cervical cancer screening. The HPV test looks for the virus that can cause the cell changes on the cervix. The cells collected during the Pap test can be tested for HPV. The HPV test could  be used to screen women aged 69 years and older, and should be used in women of any age who have unclear Pap test results. After the age of 27, women should have HPV testing at the same frequency as a Pap test.  Colorectal cancer can be detected and often prevented. Most routine colorectal cancer screening begins at the age of 1 and continues through age 69. However, your caregiver may recommend screening at an earlier age if you have risk factors for colon cancer. On a yearly basis, your caregiver may provide home test kits to check for hidden blood in the stool. Use of a small camera at the end of a tube, to directly examine the colon (sigmoidoscopy or colonoscopy), can detect the earliest forms of colorectal cancer. Talk to your caregiver about this at age 80, when routine screening begins. Direct examination of the colon should be repeated every 5 to 10 years through age 27, unless early forms of pre-cancerous polyps or small growths  are found.  Hepatitis C blood testing is recommended for all people born from [birth year] through 1966 and any individual with known risks for hepatitis C.  Practice safe sex. Use condoms and avoid high-risk sexual practices to reduce the spread of sexually transmitted infections (STIs). Sexually active women aged 24 and younger should be checked for Chlamydia, which is a common sexually transmitted infection. Older women with new or multiple partners should also be tested for Chlamydia. Testing for other STIs is recommended if you are sexually active and at increased risk.  Osteoporosis is a disease in which the bones lose minerals and strength with aging. This can result in serious bone fractures. The risk of osteoporosis can be identified using a bone density scan. Women ages 73 and over and women at risk for fractures or osteoporosis should discuss screening with their caregivers. Ask your caregiver whether you should be taking a calcium supplement or vitamin D to reduce the rate of osteoporosis.  Menopause can be associated with physical symptoms and risks. Hormone replacement therapy is available to decrease symptoms and risks. You should talk to your caregiver about whether hormone replacement therapy is right for you.  Use sunscreen. Apply sunscreen liberally and repeatedly throughout the day. You should seek shade when your shadow is shorter than you. Protect yourself by wearing long sleeves, pants, a wide-brimmed hat, and sunglasses year round, whenever you are outdoors.  Notify your caregiver of new moles or changes in moles, especially if there is a change in shape or color. Also notify your caregiver if a mole is larger than the size of a pencil eraser.  Stay current with your immunizations. Document Released: 11/20/2010 Document Revised: 09/01/2012 Document Reviewed: 11/20/2010 [facility] Patient Information 2014 [place].

## 2013-11-10 ENCOUNTER — Telehealth: Payer: Self-pay | Admitting: *Deleted

## 2013-11-10 DIAGNOSIS — N6320 Unspecified lump in the left breast, unspecified quadrant: Secondary | ICD-10-CM

## 2013-11-10 LAB — URINALYSIS W MICROSCOPIC + REFLEX CULTURE
Bilirubin Urine: NEGATIVE
Casts: NONE SEEN
Crystals: NONE SEEN
Glucose, UA: NEGATIVE mg/dL
HGB URINE DIPSTICK: NEGATIVE
KETONES UR: NEGATIVE mg/dL
Leukocytes, UA: NEGATIVE
NITRITE: NEGATIVE
PH: 5.5 (ref 5.0–8.0)
PROTEIN: NEGATIVE mg/dL
Specific Gravity, Urine: 1.009 (ref 1.005–1.030)
Urobilinogen, UA: 0.2 mg/dL (ref 0.0–1.0)

## 2013-11-10 NOTE — Telephone Encounter (Signed)
Appointment 11/19/13 @ 3:00 am

## 2013-11-10 NOTE — Telephone Encounter (Signed)
Message copied by Thamas Jaegers on Tue Nov 10, 2013 10:58 AM ------      Message from: Anastasio Auerbach      Created: Tue Nov 10, 2013  6:21 AM       Schedule Lt breast Dx mammogram and ultrasound reference Lt breast mass 7 o'clock off areola ------

## 2013-11-10 NOTE — Telephone Encounter (Signed)
Order placed at breast center they will contact patient.

## 2013-11-11 ENCOUNTER — Other Ambulatory Visit: Payer: Self-pay | Admitting: Gynecology

## 2013-11-11 MED ORDER — SULFAMETHOXAZOLE-TMP DS 800-160 MG PO TABS: 1.0000 | ORAL_TABLET | Freq: Two times a day (BID) | ORAL | Status: DC

## 2013-11-12 LAB — URINE CULTURE

## 2013-11-18 ENCOUNTER — Other Ambulatory Visit: Payer: BC Managed Care – PPO

## 2013-11-19 ENCOUNTER — Other Ambulatory Visit: Payer: BC Managed Care – PPO

## 2013-11-30 ENCOUNTER — Ambulatory Visit
Admission: RE | Admit: 2013-11-30 | Discharge: 2013-11-30 | Disposition: A | Discharge status: Home / Self Care | Payer: BC Managed Care – PPO | Source: Ambulatory Visit | Attending: Gynecology | Admitting: Gynecology

## 2013-11-30 DIAGNOSIS — N6320 Unspecified lump in the left breast, unspecified quadrant: Secondary | ICD-10-CM

## 2013-12-30 ENCOUNTER — Other Ambulatory Visit: Payer: Self-pay | Admitting: Dermatology

## 2014-01-04 ENCOUNTER — Ambulatory Visit: Payer: BC Managed Care – PPO | Admitting: Gynecology

## 2014-01-26 ENCOUNTER — Ambulatory Visit: Payer: BC Managed Care – PPO | Admitting: Gynecology

## 2014-03-03 ENCOUNTER — Other Ambulatory Visit: Payer: Self-pay

## 2014-03-03 DIAGNOSIS — Z1239 Encounter for other screening for malignant neoplasm of breast: Secondary | ICD-10-CM

## 2014-03-08 ENCOUNTER — Encounter: Payer: Self-pay | Admitting: Gynecology

## 2014-03-08 ENCOUNTER — Ambulatory Visit (INDEPENDENT_AMBULATORY_CARE_PROVIDER_SITE_OTHER): Payer: BC Managed Care – PPO | Admitting: Gynecology

## 2014-03-08 DIAGNOSIS — N6002 Solitary cyst of left breast: Secondary | ICD-10-CM

## 2014-03-08 NOTE — Progress Notes (Signed)
Whitney Cox Aug 23, 1965 [id number]        48 y.o.  [id number] follows up for reexamination.  History of very dense breasts with benign appearing cysts on ultrasound/mammogram studies previously. Was seen June 2015 with nodule at the 7:00 position. Attempted aspiration did not produce returned. She had a follow up  Diagnostic left mammogram which showed a lower inner left mass. A smaller mass in the superficial lower left breast also. Ultrasound showed a 3.6 cm simple cyst at the 8:00 position and a 1.1 cm simple cyst at the 7:30 position as described in the report. Patient states that she was to follow up in 2 months for ultrasound but did not do this. This is not noted in the report as a recommendation. She does have a routine annual mammogram, 3-D scheduled for next week.  Past medical history,surgical history, problem list, medications, allergies, family history and social history were all reviewed and documented in the EPIC chart.  Directed ROS with pertinent positives and negatives documented in the history of present illness/assessment and plan.  Exam: [name] assistant General appearance:  Normal Both breasts examined lying and sitting. Well-healed reduction scars bilaterally.  Right breast without masses, retractions, discharge or adenopathy. Left breast with palpable 3+ centimeter cystic feeling mass 7 to 8:00 position as diagrammed below. Smaller 2 cm area 6:00 position of the areola as diagrammed below. No overlying skin changes, nipple discharge or axillary adenopathy. Physical Exam  Pulmonary/Chest:       Assessment/Plan:  48 y.o. [id number] with palpable areas consistent with her last ultrasound showing simple cysts. Does have 3-D mammogram scheduled next week. We'll make sure that this is diagnostic with ultrasound. Options to have them aspirated these areas versus observing them if they appear to be simple cysts reviewed. Ultimate referral for excision discussed. Patient prefers no  intervention if appears to be simple cyst she is comfortable with following. She will follow up after the studies next week.     Anastasio Auerbach MD, 12:25 PM 03/08/2014

## 2014-03-08 NOTE — Patient Instructions (Signed)
Follow up for mammogram and ultrasound as scheduled 

## 2014-03-09 ENCOUNTER — Other Ambulatory Visit: Payer: Self-pay | Admitting: Gynecology

## 2014-03-09 ENCOUNTER — Telehealth: Payer: Self-pay | Admitting: *Deleted

## 2014-03-09 DIAGNOSIS — N63 Unspecified lump in unspecified breast: Secondary | ICD-10-CM

## 2014-03-09 NOTE — Telephone Encounter (Signed)
Message copied by Thamas Jaegers on Tue Mar 09, 2014  9:40 AM ------      Message from: Anastasio Auerbach      Created: Mon Mar 08, 2014 12:31 PM       Patient has mammogram scheduled next week at 2:30 on 28 October. I would like a diagnostic 3-D and ultrasound reference palpable masses x 2 left breast previously visualized as simple cysts. ------

## 2014-03-09 NOTE — Telephone Encounter (Signed)
Order placed, breast center aware that pt will need the below and not just mammogram screening.

## 2014-03-17 ENCOUNTER — Ambulatory Visit
Admission: RE | Admit: 2014-03-17 | Discharge: 2014-03-17 | Disposition: A | Discharge status: Home / Self Care | Payer: BC Managed Care – PPO | Source: Ambulatory Visit | Attending: Gynecology | Admitting: Gynecology

## 2014-03-17 ENCOUNTER — Ambulatory Visit: Payer: BC Managed Care – PPO

## 2014-03-17 DIAGNOSIS — N63 Unspecified lump in unspecified breast: Secondary | ICD-10-CM

## 2014-03-22 ENCOUNTER — Encounter: Payer: Self-pay | Admitting: Gynecology

## 2014-04-01 ENCOUNTER — Encounter: Payer: Self-pay | Admitting: Sports Medicine

## 2014-04-01 ENCOUNTER — Ambulatory Visit (INDEPENDENT_AMBULATORY_CARE_PROVIDER_SITE_OTHER): Payer: BC Managed Care – PPO | Admitting: Sports Medicine

## 2014-04-01 VITALS — BP 126/87 | HR 64 | Ht 64.0 in | Wt 121.0 lb

## 2014-04-01 DIAGNOSIS — R269 Unspecified abnormalities of gait and mobility: Secondary | ICD-10-CM | POA: Diagnosis not present

## 2014-04-01 DIAGNOSIS — M791 Myalgia: Secondary | ICD-10-CM | POA: Diagnosis not present

## 2014-04-01 DIAGNOSIS — M7918 Myalgia, other site: Secondary | ICD-10-CM

## 2014-04-01 MED ORDER — GABAPENTIN 100 MG PO CAPS: 100.0000 mg | ORAL_CAPSULE | Freq: Every day | ORAL | Status: DC

## 2014-04-01 NOTE — Patient Instructions (Signed)
Start medication at night.  Hip rotation series per hand out Forward and sideways steps ups High Knee and Skipping drills 3X50 yards + [employer]

## 2014-04-01 NOTE — Progress Notes (Signed)
Whitney Cox - 48 y.o. female MRN [id number]  Date of birth: [birth year]  CC & HPI:  Whitney Cox is here for evaluation of : Left gluteal pain: left posterior lateral hip pain that has been present for approximately 2 years after a tough mudder race. She reports 2 incidents where she landed directly on her buttock. She was seen in August 2014 and was found to have fibrotic changes within the piriformis. Since that time she has continued to run a reports running is no longer as enjoyable due to the significant discomfort she is having. She is also considerably slowed down due to this discomfort and reports she feels that her stride has changed. Her pain is worse after sitting for prolonged period or standing for prolonged periods. Her pain does not radiate down past her upper leg and she denies any significant numbness, weakness, change in bowel or bladder, fevers, chills. She has had prior back issues and reports x-rays at a chiropractor that showed significant disc space disease.  She's had increased stress at work and has been driving more often to the [employer].  Driving this much is exacerbating her discomfort but she has continued to run inspite of the discomfort. She denies any significant pain at night. No specific medications, TherEx, or bracing.   ROS:  Per HPI.   HISTORY: Past Medical, Surgical, Social, and Family History Reviewed & Updated per EMR.  Pertinent Historical Findings include: Hx of Basal Cell skin CA s/p excision. Otherwise relatively healthy. No Chronic medications Nonsmoker, occasional EtOH married avid Psychologist, clinical   Historical Data Reviewed: Prior evidence of left piriformis hematoma noted on [facility] ultrasound  OBJECTIVE:  VS:   HT:5\' 4"  (162.6 cm)   WT:121 lb (54.885 kg)  BMI:20.8          BP:126/87 mmHg  HR:64bpm  TEMP: ( )  RESP:   PHYSICAL EXAM: GENERAL: Adult Caucasian  female. In no discomfort; no respiratory distress   PSYCH: alert  and appropriate, good insight   NEURO: sensation is intact to light touch in bilateral lower extremities, patellar and Achilles reflexes 1+/4 and symmetric.  VASCULAR: DP pulses 2+/4.  No significant edema.    Left hip EXAM: Appearance: Overall normal-appearing, she has a functional left short leg ~1cm  while laying but corrects with sitting. Bilateral ASIS compression test is normal.  Skin: No overlying erythema/ecchymosis.  Palpation: Minimal TTP over: left piriformis  No TTP over: greater trochanter  Strength & ROM: 5/5 Strength and full active ROM in: bilateral hip internal/external rotation.   Hip Abduction strength 4+/5 on left and 5/5 on right.  Pain with FABER to buttock and stretch most with piriformis testing.    Special Tests: Bilateral negative straight leg raise.     Running Gait & Functional Exam:  General: Straight with bilateral excessive hip swing that is slightly more pronounced on the left. Left leg comes across midline, right leg knot to midline. Poor leg lift.  Strike/foot: Heel toe strike her with slight over pronation of left foot     ASSESSMENT: 1. Abnormality of gait   2. Piriformis muscle pain    Previously noted fibrotic changes within the piriformis and associated gait changes today likely due to hip flexor inhibition and reliance on external rotators for gait.  PLAN: See problem based charting & AVS for additional documentation. - small scaphoid pads placed in the left shoe with noted improvement in crossing of the midline and pronation of the left  foot. - therapeutic exercises per AVS include hip flexor recruitment and hip abduction recruitment exercises. - Discussed this may potentially be a nerve root impingement from degenerative changes in her back and will trial gabapentin 100mg  qhs after discussing the risks and benefits.  We have asked her to bring her x-rays that were previously obtained of her lumbar spines we may review these. > Return in about 3  months (around 07/02/2014).   (1-3 months per patient's schedule) > If not improved or continued persistent pain with running consider re-ultrasound of the gluteal region for consideration of injection versus nitroglycerin therapy versus further lumbar spine evaluation.

## 2014-04-27 ENCOUNTER — Other Ambulatory Visit: Payer: Self-pay | Admitting: Dermatology

## 2014-06-22 ENCOUNTER — Encounter: Payer: Self-pay | Admitting: Sports Medicine

## 2014-06-22 ENCOUNTER — Ambulatory Visit (INDEPENDENT_AMBULATORY_CARE_PROVIDER_SITE_OTHER): Payer: BLUE CROSS/BLUE SHIELD | Admitting: Sports Medicine

## 2014-06-22 VITALS — BP 105/64 | HR 53 | Ht 63.0 in | Wt 123.0 lb

## 2014-06-22 DIAGNOSIS — M25562 Pain in left knee: Secondary | ICD-10-CM | POA: Diagnosis not present

## 2014-06-22 NOTE — Progress Notes (Signed)
   Subjective:    Patient ID: Whitney Cox, female    DOB: Jul 28, 1965, 49 y.o.   MRN: [id number]  HPI [name] is a 49 year old female who presents with left knee pain. Onset of symptoms was about 2-1/2 weeks ago when she was running and felt sudden pain located around the anterior left knee. She says that she continue to run through the pain, which intensified after finishing her run. She is currently in training for the [employer] at the end of April. She is usually a trail runner, but has been transitioning to concrete. She was seen a month ago when her orthotics were modified and she has been recovering successfully from a piriformis strain with residual scar tissue. She says that the left knee was significantly swollen following onset. She said that there is a occasional painful cracking in the left knee, but she cannot specifically localize its location. She denies any knee giving way or feeling of instability. She has tried wearing a compression sleeve and very occasional anti-inflammatory. She rested for one week, but then tested it twice since the injury. She has been able to run, though with occasional pain. Her swelling has improved, and her pain is about 50% better.  Past medical history, social history, medications, and allergies were reviewed and are up to date in the chart. Review of Systems 7 point review of systems was performed and was otherwise negative unless noted in the history of present illness.     Objective:   Physical Exam BP 105/64 mmHg  Pulse 53  Ht 5\' 3"  (1.6 m)  Wt 123 lb (55.792 kg)  BMI 21.79 kg/m2 GEN: The patient is well-developed well-nourished female and in no acute distress.  She is awake alert and oriented x3. SKIN: warm and well-perfused, no rash  EXTR: No lower extremity edema or calf tenderness Neuro: Strength 5/5 globally. Sensation intact throughout. No focal deficits. Vasc: +2 bilateral distal pulses. No edema.  MSK: Examination of the  left knee reveals +1 effusion. No overlying erythema or induration. Range of motion is from 0-120 with minimal pain. Negative McMurray's. Negative patellar grind test. No tenderness at the patellar tendon. Mild lateral joint line tenderness to palpation. Mild iliotibial band tenderness to palpation. No laxity with valgus or varus stress testing.  Limited musculoskeletal ultrasound: Long and short axis views were obtained of the left knee which show a moderate sized effusion in the suprapatellar pouch. Medial meniscus appears normal. Quadriceps and patellar tendons appear normal. The lateral meniscus appears to have some hypoechoic fluid surrounding it with poorly defined meniscus and some protrusion. She has a small spur at the medial trochlear groove.    Assessment & Plan:  Please see problem based assessment and plan in the problem list.

## 2014-06-22 NOTE — Assessment & Plan Note (Signed)
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 Training for [employer] 09/18/14. -[place] today with hypoechoic lateral meniscus with protrusion, moderate effusion -Start Naproxen 500mg  bidAC x 7 days, then prn -Continue compression sleeve when active -Modify training to include recumbent bike, cross-train, avoid deep lunges and squats -Continue to strengthen hip abductors, lateral shuffle when improved -Plan follow-up in 4-6 weeks for re-scan of effusion, meniscus, or sooner if needed

## 2014-07-20 ENCOUNTER — Ambulatory Visit: Payer: BLUE CROSS/BLUE SHIELD | Admitting: Sports Medicine

## 2014-07-20 ENCOUNTER — Encounter: Payer: Self-pay | Admitting: Sports Medicine

## 2014-07-20 ENCOUNTER — Ambulatory Visit (INDEPENDENT_AMBULATORY_CARE_PROVIDER_SITE_OTHER): Payer: BLUE CROSS/BLUE SHIELD | Admitting: Sports Medicine

## 2014-07-20 VITALS — BP 130/78 | Ht 64.0 in | Wt 120.0 lb

## 2014-07-20 DIAGNOSIS — M25562 Pain in left knee: Secondary | ICD-10-CM | POA: Diagnosis not present

## 2014-07-20 NOTE — Patient Instructions (Signed)
Wear the compression sleeve during runs and biking  Do your runs 4 days a week and bike 3 days  Use aleve if needed as well as tumeric for anti-inflammatory

## 2014-07-20 NOTE — Progress Notes (Signed)
Patient ID: Whitney Cox, female   DOB: April 11, 1966, 49 y.o.   MRN: [id number]  BP 130/78 mmHg  Ht 5\' 4"  (1.626 m)  Wt 120 lb (54.432 kg)  BMI 20.59 kg/m2  F/u of left knee where she had some degenerative changes Lateral meniscus tear, with lateral pain shooting at times Had a skiing injury back at 49y.o. With same knee Hurts when driving for long periods, makes a trip to [place] monthly Pt has continued running around 20 mi each week, mostly on trails not pavement  Swelling is down No bad pain after long runs   Exam NAD  Left: Full extension, 165 flexion mcmurray's good motion, with a little click Good strength Excellent quad development Good abduction strength, bilaterally No effusion today on exam  Right: 175 flexion mcmurray's good motion, with a little click Good strength  Running gait looks good with no limp  Gave scaphoid pads to use on insoles Also gave compression sleeve to use for left knee

## 2014-07-20 NOTE — Assessment & Plan Note (Signed)
Much improved sxs from deg left men tear  Cont exercise program  Start using a compression sleeve  Modify trainig up to marathon  Re check after race

## 2014-07-27 ENCOUNTER — Ambulatory Visit: Payer: BLUE CROSS/BLUE SHIELD | Admitting: Sports Medicine

## 2014-08-31 ENCOUNTER — Encounter: Payer: Self-pay | Admitting: Sports Medicine

## 2014-08-31 ENCOUNTER — Ambulatory Visit (INDEPENDENT_AMBULATORY_CARE_PROVIDER_SITE_OTHER): Payer: BLUE CROSS/BLUE SHIELD | Admitting: Sports Medicine

## 2014-08-31 VITALS — BP 136/58 | Ht 64.0 in | Wt 120.0 lb

## 2014-08-31 DIAGNOSIS — M25562 Pain in left knee: Secondary | ICD-10-CM

## 2014-08-31 MED ORDER — METHYLPREDNISOLONE ACETATE 40 MG/ML IJ SUSP
40.0000 mg | Freq: Once | INTRAMUSCULAR | Status: AC
  Administered 2014-08-31: 40 mg via INTRA_ARTICULAR

## 2014-08-31 NOTE — Progress Notes (Signed)
   Subjective:    Patient ID: Whitney Cox, female    DOB: 1965-08-27, 49 y.o.   MRN: [id number]  HPI   Patient comes in today with persistent left knee pain. She was previously treated by Dr. Nolene EbbsArdis Hughs and Dr. Oneida Alar for this same problem. Previous ultrasound suggested a possible lateral meniscal tear. Although the patient's pain was initially along the lateral knee it is now localized to the medial knee and posterior knee. She describes a sharp stabbing discomfort that is intermittent. She gets associated swelling as well. She has a marathon coming up in 2 weeks and despite her pain and swelling she has continued to train. She was given some arch supports as well as a compression sleeve at a previous office visit but both of these are uncomfortable and she has discontinued them both. No prior surgeries to the knee. No numbness or tingling. She does get some pain at night as well.     Review of Systems     Objective:   Physical Exam Well-developed, fit appearing. No acute distress. Awake alert and oriented 3  Left knee: Full range of motion. No effusion. There is tenderness to palpation along the medial joint line with pain but no popping with McMurray's. Positive Thessaly's. No tenderness to palpation along the lateral joint line. Good joint stability. No palpable Baker's cyst. Neurovascular intact distally. Walking without significant limp.       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 Although she does understand that there is a risk of further injury with both continuing to train and run she is adamant that she is going to complete the marathon. She is running the marathon for her [employer] and her mother suffered a stroke last week so she is dedicated to at least try. I discussed the risks and benefits of a cortisone injection as they  relate to the procedure itself as well as a potential for "masking" pain leading to further injury. She is willing to accept this risk. Therefore I will inject her knee today with cortisone. She will follow-up with me a week after her marathon. She understands that she will need to rest after her race and concentrate on her rehabilitation. If her symptoms continue to worsen then we may need to consider further diagnostic imaging as well.

## 2014-09-20 ENCOUNTER — Encounter: Payer: Self-pay | Admitting: Sports Medicine

## 2014-09-20 ENCOUNTER — Ambulatory Visit (INDEPENDENT_AMBULATORY_CARE_PROVIDER_SITE_OTHER): Payer: BLUE CROSS/BLUE SHIELD | Admitting: Sports Medicine

## 2014-09-20 VITALS — BP 136/68 | Ht 64.0 in | Wt 120.0 lb

## 2014-09-20 DIAGNOSIS — M25552 Pain in left hip: Secondary | ICD-10-CM

## 2014-09-20 DIAGNOSIS — M25562 Pain in left knee: Secondary | ICD-10-CM

## 2014-09-20 NOTE — Progress Notes (Signed)
   Subjective:    Patient ID: Whitney Cox, female    DOB: [birth year], 49 y.o.   MRN: [id number]  HPI   Patient comes in today for follow-up on left knee pain. Left knee pain has resolved after recent cortisone injection. She was able to complete her marathon this past weekend but during her run she began to experience some lateral left hip pain. Pain was associated with some radiating discomfort down the entire left leg. Despite this she was able to complete her marathon. Over the past couple of days her hip pain has improved. She has had problems with piriformis syndrome in the past. She localizes her hip pain just posterior to the greater trochanter. She denies any associated numbness or tingling. No groin pain.    Review of Systems     Objective:   Physical Exam Well-developed, fit appearing. No acute distress. Awake alert and oriented 3. Vital signs reviewed  Left hip: Smooth painless hip range of motion with a negative logroll. Slight tenderness to palpation at the insertion of the gluteus medius tendon. No tenderness over the greater trochanteric bursa. Negative straight leg raise. Negative Trendelenburg.  Left knee: Full range of motion. No effusion. No joint line tenderness. Negative McMurray's. Good joint stability. Neuro vascular intact distally. Walking without a limp.       Assessment & Plan:  Improved left knee pain likely secondary to degenerative meniscal tear Acute left hip pain secondary to gluteus medius tendon strain  Patient will cross train on a bike or in a pool for the next couple of weeks. I have re-educated her on a home exercise program consisting of hip and knee exercises. After 2 weeks of crosstraining she may start a return to running program based on her symptoms. Follow-up with me as needed.

## 2014-11-15 ENCOUNTER — Emergency Department (HOSPITAL_BASED_OUTPATIENT_CLINIC_OR_DEPARTMENT_OTHER)
Admission: EM | Admit: 2014-11-15 | Discharge: 2014-11-16 | Disposition: A | Discharge status: Home / Self Care | Payer: BLUE CROSS/BLUE SHIELD | Attending: Emergency Medicine | Admitting: Emergency Medicine

## 2014-11-15 ENCOUNTER — Encounter (HOSPITAL_BASED_OUTPATIENT_CLINIC_OR_DEPARTMENT_OTHER): Payer: Self-pay | Admitting: *Deleted

## 2014-11-15 DIAGNOSIS — Z87891 Personal history of nicotine dependence: Secondary | ICD-10-CM | POA: Insufficient documentation

## 2014-11-15 DIAGNOSIS — Z3202 Encounter for pregnancy test, result negative: Secondary | ICD-10-CM | POA: Diagnosis not present

## 2014-11-15 DIAGNOSIS — N390 Urinary tract infection, site not specified: Secondary | ICD-10-CM | POA: Diagnosis not present

## 2014-11-15 DIAGNOSIS — Z79899 Other long term (current) drug therapy: Secondary | ICD-10-CM | POA: Diagnosis not present

## 2014-11-15 DIAGNOSIS — Z85828 Personal history of other malignant neoplasm of skin: Secondary | ICD-10-CM | POA: Insufficient documentation

## 2014-11-15 DIAGNOSIS — R300 Dysuria: Secondary | ICD-10-CM | POA: Diagnosis present

## 2014-11-15 LAB — URINE MICROSCOPIC-ADD ON

## 2014-11-15 LAB — URINALYSIS, ROUTINE W REFLEX MICROSCOPIC
Bilirubin Urine: NEGATIVE
GLUCOSE, UA: NEGATIVE mg/dL
KETONES UR: NEGATIVE mg/dL
NITRITE: POSITIVE — AB
Protein, ur: NEGATIVE mg/dL
SPECIFIC GRAVITY, URINE: 1.003 — AB (ref 1.005–1.030)
Urobilinogen, UA: 1 mg/dL (ref 0.0–1.0)
pH: 7 (ref 5.0–8.0)

## 2014-11-15 LAB — PREGNANCY, URINE: Preg Test, Ur: NEGATIVE

## 2014-11-15 MED ORDER — NITROFURANTOIN MONOHYD MACRO 100 MG PO CAPS: 100.0000 mg | ORAL_CAPSULE | Freq: Two times a day (BID) | ORAL | Status: DC

## 2014-11-15 MED ORDER — PHENAZOPYRIDINE HCL 200 MG PO TABS: 200.0000 mg | ORAL_TABLET | Freq: Three times a day (TID) | ORAL | Status: DC

## 2014-11-15 MED ORDER — NAPROXEN 375 MG PO TABS: 375.0000 mg | ORAL_TABLET | Freq: Two times a day (BID) | ORAL | Status: DC

## 2014-11-15 MED ORDER — NITROFURANTOIN MONOHYD MACRO 100 MG PO CAPS
100.0000 mg | ORAL_CAPSULE | Freq: Once | ORAL | Status: AC
  Administered 2014-11-15: 100 mg via ORAL
  Filled 2014-11-15: qty 1

## 2014-11-15 MED ORDER — NAPROXEN 250 MG PO TABS
500.0000 mg | ORAL_TABLET | Freq: Once | ORAL | Status: AC
  Administered 2014-11-15: 500 mg via ORAL
  Filled 2014-11-15: qty 2

## 2014-11-15 NOTE — ED Provider Notes (Addendum)
CSN: [id number]     Arrival date & time 11/15/14  2308 History   None    Chief Complaint  Patient presents with  . Dysuria    This chart was scribed for  Kailin Leu, MD by Altamease Oiler, ED Scribe. This patient was seen in room MH11/MH11 and the patient's care was started at 11:20 PM.  Patient is a 49 y.o. female presenting with dysuria. The history is provided by the patient. No language interpreter was used.  Dysuria Pain quality:  Burning Onset quality:  Gradual Duration:  24 hours Timing:  Constant Progression:  Unchanged Chronicity:  New Recent urinary tract infections: yes   Relieved by:  Nothing Worsened by:  Nothing tried Ineffective treatments: AZO. Urinary symptoms: no discolored urine   Associated symptoms: no fever and no flank pain   Risk factors: recurrent urinary tract infections   Risk factors: no renal disease    Whitney Cox is a 49 y.o. female who presents to the Emergency Department complaining of dysuria with onset 1 day ago. Associated symproms include increased urinary frequency. Pt notes having similar symptoms in the past with UTIs.  Cippro used to relieve her symptoms but she states that she has developed an allergy. Azo did not relieve her symptoms PTA.    Past Medical History  Diagnosis Date  . Basal cell cancer   . Squamous cell carcinoma    Past Surgical History  Procedure Laterality Date  . Dilation and curettage of uterus  1992  . Dilation and curettage of uterus  1995  . Reduction mammaplasty  2006  . Mouth surgery    . Basal cell carcinoma excision     Family History  Problem Relation Age of Onset  . Breast cancer Mother 72  . Osteoporosis Mother   . Diabetes Sister   . Osteoporosis Sister   . Diabetes Maternal Grandmother   . Osteoporosis Maternal Grandmother   . Rheum arthritis Sister    History  Substance Use Topics  . Smoking status: Former Research scientist (life sciences)  . Smokeless tobacco: Never Used  . Alcohol Use: 4.2 oz/week    7  Glasses of wine per week   OB History    Gravida Para Term Preterm AB TAB SAB Ectopic Multiple Living   5 2 2  3     2      Review of Systems  Constitutional: Negative for fever.  Genitourinary: Positive for dysuria and frequency. Negative for flank pain.  Musculoskeletal: Negative for back pain.  All other systems reviewed and are negative.     Allergies  Ciprofloxacin hcl  Home Medications   Prior to Admission medications   Medication Sig Start Date End Date Taking? Authorizing Provider  diphenoxylate-atropine (LOMOTIL) 2.5-0.025 MG per tablet Take 1 tablet by mouth. 09/07/14 09/07/15  Historical Provider, MD  gabapentin (NEURONTIN) 100 MG capsule Take 1 capsule (100 mg total) by mouth at bedtime. 04/01/14   Stefanie Libel, MD  Nutritional Supplements (JUICE PLUS FIBRE PO) Take by mouth.    Historical Provider, MD  triamcinolone ointment (KENALOG) 0.5 %  01/12/14   Historical Provider, MD   BP 120/79 mmHg  Pulse 58  Temp(Src) 98.2 F (36.8 C) (Oral)  Resp 18  Ht 5\' 3"  (1.6 m)  Wt 120 lb (54.432 kg)  BMI 21.26 kg/m2  SpO2 100%  LMP 11/08/2014 Physical Exam  Constitutional: She is oriented to person, place, and time. She appears well-developed and well-nourished. No distress.  HENT:  Head: Normocephalic and atraumatic.  Mouth/Throat: Oropharynx is clear and moist and mucous membranes are normal.  Moist mucous membranes  Eyes: EOM are normal. Pupils are equal, round, and reactive to light.  Neck: Normal range of motion. Neck supple.  Cardiovascular: Normal rate and regular rhythm.   Pulmonary/Chest: Effort normal and breath sounds normal. No respiratory distress. She has no wheezes. She has no rales.  Abdominal: Soft. Bowel sounds are normal. She exhibits no mass. There is no tenderness. There is no rebound and no guarding.  Musculoskeletal: Normal range of motion. She exhibits no edema.  Neurological: She is alert and oriented to person, place, and time. She has normal  reflexes.  Skin: Skin is warm and dry.  Psychiatric: She has a normal mood and affect.  Nursing note and vitals reviewed.   ED Course  Procedures   DIAGNOSTIC STUDIES: Oxygen Saturation is 100% on RA, normal by my interpretation.    COORDINATION OF CARE: 11:20 PM Discussed treatment plan which includes lab work, abx treatment, and pain management with pt at bedside and pt agreed to plan.  Labs Review Labs Reviewed  PREGNANCY, URINE  URINALYSIS, ROUTINE W REFLEX MICROSCOPIC (NOT AT [facility])    Imaging Review No results found.   EKG Interpretation None      MDM   Final diagnoses:  None   Will treat with macrobid x 7 days, pyridium and naproxen.  Close follow up with your PMD for recheck.  Return for fevers intractable pain, intractable vomiting inability to urinate or any concerns  I personally performed the services described in this documentation, which was scribed in my presence. The recorded information has been reviewed and is accurate.    Veatrice Kells, MD 11/15/14 2346  Brynn Reznik, MD 11/15/14 2355

## 2014-11-15 NOTE — ED Notes (Signed)
Pt c/o painful freq urination x 1 day

## 2014-11-15 NOTE — ED Notes (Signed)
C/o urinary freq and burning x 1 day

## 2014-11-16 NOTE — ED Notes (Signed)
MD at bedside discussing test results and dispo plan of care. 

## 2014-11-30 ENCOUNTER — Other Ambulatory Visit: Payer: Self-pay | Admitting: Family Medicine

## 2014-11-30 DIAGNOSIS — Z1231 Encounter for screening mammogram for malignant neoplasm of breast: Secondary | ICD-10-CM

## 2015-01-18 ENCOUNTER — Ambulatory Visit
Admission: RE | Admit: 2015-01-18 | Discharge: 2015-01-18 | Disposition: A | Discharge status: Home / Self Care | Payer: BLUE CROSS/BLUE SHIELD | Source: Ambulatory Visit | Attending: Sports Medicine | Admitting: Sports Medicine

## 2015-01-18 ENCOUNTER — Encounter: Payer: Self-pay | Admitting: Sports Medicine

## 2015-01-18 ENCOUNTER — Ambulatory Visit (INDEPENDENT_AMBULATORY_CARE_PROVIDER_SITE_OTHER): Payer: BLUE CROSS/BLUE SHIELD | Admitting: Sports Medicine

## 2015-01-18 ENCOUNTER — Other Ambulatory Visit: Payer: Self-pay | Admitting: Sports Medicine

## 2015-01-18 VITALS — BP 120/77 | Ht 63.0 in | Wt 122.0 lb

## 2015-01-18 DIAGNOSIS — M25562 Pain in left knee: Secondary | ICD-10-CM

## 2015-01-18 DIAGNOSIS — M25462 Effusion, left knee: Secondary | ICD-10-CM

## 2015-01-18 NOTE — Progress Notes (Signed)
  Whitney Cox - 49 y.o. female MRN [id number]  Date of birth: 21-Feb-1966   Whitney Cox is a 49 y.o. female who presents today for left knee pain. She has a hx of left knee pain secondary to degenerative meniscal changes. Hx of skiing accident when she was 49 years old that hurt her left knee and never sought evaluation.   She was trail running about a month ago and had a fall. She fell forward and is unsure of how she landed. She is did hit her left knee on the ground. She had a mild effusion after this fall and has had medial and superior knee pain since. Pain is throbbing and shooting in nature. The knee will give way from time to time but no locking in place. She has been using ice and rest for treatment. She is still running about 5-7 miles two times per week.  The pain is worse while walking down stairs.    PMHx -  reviewed.  PSHx -  reviewed.   FHx - reviewed.   Medications - reviewed    ROS Per HPI   Exam:  Filed Vitals:   01/18/15 1045  BP: 120/77   Gen: NAD Cardiorespiratory - Normal respiratory effort/rate.   Knee Exam:  Laterality: left Appearance: no erythema or ecchymosis  Edema: mild effusion appreciated   Tenderness: no TTP upon joint line, pre-patellar bursa, or patella  Range of Motion: normal active and passive ROM  Normal valgus and varus maneuvers  Maneuvers: Lachman's: neg McMurray's: neg  Posterior Drawer: neg  Thessaly: neg  Patellar apprehension test: neg  Strength:  Quadricep: 5/5 Hamstring: 5/5 Gait: normal     Imaging:  Limited US: Left knee  Suprapatellar pouch is displaying a moderate effusion. Medial meniscus intact, lateral meniscus with outpouching and degenerative changes. Quad and patellar tendon intact. No fracture of patella.  Findings suggestive of moderate knee effusion and degenerative changes of lateral mensicus.   Left knee pain Knee pain associated with fall that is concerning for worsening meniscal tear  ACL seems intact with  testing  Possible for patellar subluxation with effusion but exam not indicative.  - obtain x-ray left knee  - MRI w/out contrast left knee  - Dr. Micheline Chapman will call with the results and tailor treatment plan based on results  - she will continue using ICE and compression  - can exercise with stationary bike, swimming, elliptical      Patient seen and evaluated with the above-named resident. I agree with the plan of care. Patient has had reoccurring knee pain and effusions. Despite conservative treatment including a cortisone injection her symptoms continue to recur. X-rays were ordered and reviewed. Nothing acute is seen. No significant degenerative changes appreciated. I recommended that we proceed with an MRI scan of her left knee to rule out a meniscal tear. Phone follow-up after that study to delineate further treatment. In the meantime she may continue with nonimpact activity using pain as her guide and will continue using her body helix compression sleeve when active.

## 2015-01-18 NOTE — Assessment & Plan Note (Signed)
Knee pain associated with fall that is concerning for worsening meniscal tear  ACL seems intact with testing  Possible for patellar subluxation with effusion but exam not indicative.  - obtain x-ray left knee  - MRI w/out contrast left knee  - Dr. Micheline Chapman will call with the results and tailor treatment plan based on results  - she will continue using ICE and compression  - can exercise with stationary bike, swimming, elliptical

## 2015-01-25 ENCOUNTER — Ambulatory Visit
Admission: RE | Admit: 2015-01-25 | Discharge: 2015-01-25 | Disposition: A | Discharge status: Home / Self Care | Payer: BLUE CROSS/BLUE SHIELD | Source: Ambulatory Visit | Attending: Sports Medicine | Admitting: Sports Medicine

## 2015-01-25 ENCOUNTER — Telehealth: Payer: Self-pay | Admitting: Sports Medicine

## 2015-01-25 DIAGNOSIS — M25562 Pain in left knee: Secondary | ICD-10-CM

## 2015-01-25 NOTE — Telephone Encounter (Signed)
I spoke with the patient on the phone today after reviewing the MRI findings of her left knee. She has an oblique tear through the posterior horn of the medial meniscus which extends to the inferior articular surface. She also has some partial thickness cartilage loss at the medial femoral condyle and medial tibial plateau. Rest of her knee appears to be fairly unremarkable. Based on her length of symptoms I have recommended referral to Dr. Noemi Chapel to discuss merits of arthroscopy. Further treatment will be per the discretion of Dr. Noemi Chapel. Follow-up with me as needed.

## 2015-01-25 NOTE — Telephone Encounter (Signed)
Dr Raliegh Ip [facility] [address] [place] [address] Thursday September  8th at 230pm Phone [phone] Pt informed

## 2015-01-27 ENCOUNTER — Other Ambulatory Visit: Payer: BLUE CROSS/BLUE SHIELD

## 2015-03-22 ENCOUNTER — Ambulatory Visit
Admission: RE | Admit: 2015-03-22 | Discharge: 2015-03-22 | Disposition: A | Discharge status: Home / Self Care | Payer: BLUE CROSS/BLUE SHIELD | Source: Ambulatory Visit | Attending: Family Medicine | Admitting: Family Medicine

## 2015-03-22 ENCOUNTER — Other Ambulatory Visit: Payer: Self-pay | Admitting: Family Medicine

## 2015-03-22 DIAGNOSIS — N644 Mastodynia: Secondary | ICD-10-CM

## 2015-03-22 DIAGNOSIS — Z1231 Encounter for screening mammogram for malignant neoplasm of breast: Secondary | ICD-10-CM

## 2015-05-06 ENCOUNTER — Ambulatory Visit
Admission: RE | Admit: 2015-05-06 | Discharge: 2015-05-06 | Disposition: A | Discharge status: Home / Self Care | Payer: BLUE CROSS/BLUE SHIELD | Source: Ambulatory Visit | Attending: Family Medicine | Admitting: Family Medicine

## 2015-05-06 DIAGNOSIS — N644 Mastodynia: Secondary | ICD-10-CM

## 2016-04-26 ENCOUNTER — Encounter: Payer: Self-pay | Admitting: Gynecology

## 2016-04-26 ENCOUNTER — Ambulatory Visit (INDEPENDENT_AMBULATORY_CARE_PROVIDER_SITE_OTHER): Payer: BLUE CROSS/BLUE SHIELD | Admitting: Gynecology

## 2016-04-26 VITALS — BP 118/72 | Ht 64.0 in | Wt 125.0 lb

## 2016-04-26 DIAGNOSIS — Z13220 Encounter for screening for lipoid disorders: Secondary | ICD-10-CM | POA: Diagnosis not present

## 2016-04-26 DIAGNOSIS — Z01419 Encounter for gynecological examination (general) (routine) without abnormal findings: Secondary | ICD-10-CM | POA: Diagnosis not present

## 2016-04-26 DIAGNOSIS — Z1329 Encounter for screening for other suspected endocrine disorder: Secondary | ICD-10-CM

## 2016-04-26 LAB — COMPREHENSIVE METABOLIC PANEL
ALT: 7 U/L (ref 6–29)
AST: 13 U/L (ref 10–35)
Albumin: 4.1 g/dL (ref 3.6–5.1)
Alkaline Phosphatase: 38 U/L (ref 33–130)
BUN: 11 mg/dL (ref 7–25)
CALCIUM: 9.1 mg/dL (ref 8.6–10.4)
CO2: 29 mmol/L (ref 20–31)
Chloride: 102 mmol/L (ref 98–110)
Creat: 0.79 mg/dL (ref 0.50–1.05)
Glucose, Bld: 80 mg/dL (ref 65–99)
Potassium: 3.6 mmol/L (ref 3.5–5.3)
SODIUM: 139 mmol/L (ref 135–146)
Total Bilirubin: 0.7 mg/dL (ref 0.2–1.2)
Total Protein: 6.4 g/dL (ref 6.1–8.1)

## 2016-04-26 LAB — CBC WITH DIFFERENTIAL/PLATELET
BASOS PCT: 0 %
Basophils Absolute: 0 cells/uL (ref 0–200)
EOS PCT: 2 %
Eosinophils Absolute: 110 cells/uL (ref 15–500)
HCT: 38.2 % (ref 35.0–45.0)
Hemoglobin: 12.6 g/dL (ref 11.7–15.5)
LYMPHS PCT: 44 %
Lymphs Abs: 2420 cells/uL (ref 850–3900)
MCH: 30.9 pg (ref 27.0–33.0)
MCHC: 33 g/dL (ref 32.0–36.0)
MCV: 93.6 fL (ref 80.0–100.0)
MONOS PCT: 11 %
MPV: 10.5 fL (ref 7.5–12.5)
Monocytes Absolute: 605 cells/uL (ref 200–950)
NEUTROS PCT: 43 %
Neutro Abs: 2365 cells/uL (ref 1500–7800)
Platelets: 227 10*3/uL (ref 140–400)
RBC: 4.08 MIL/uL (ref 3.80–5.10)
RDW: 12.9 % (ref 11.0–15.0)
WBC: 5.5 10*3/uL (ref 3.8–10.8)

## 2016-04-26 LAB — LIPID PANEL
Cholesterol: 172 mg/dL (ref <200)
HDL: 91 mg/dL (ref >50)
LDL CALC: 74 mg/dL (ref <100)
Total CHOL/HDL Ratio: 1.9 Ratio (ref <5.0)
Triglycerides: 36 mg/dL (ref <150)
VLDL: 7 mg/dL (ref <30)

## 2016-04-26 LAB — TSH: TSH: 1.65 mIU/L

## 2016-04-26 NOTE — Progress Notes (Signed)
    Mairin Brower [birth year] [id number]        50 y.o.  [id number]  for annual exam.    Past medical history,surgical history, problem list, medications, allergies, family history and social history were all reviewed and documented as reviewed in the EPIC chart.  ROS:  Performed with pertinent positives and negatives included in the history, assessment and plan.   Additional significant findings :  None   Exam: Caryn Bee assistant Vitals:   04/26/16 1555  BP: 118/72  Weight: 125 lb (56.7 kg)  Height: 5\' 4"  (1.626 m)   Body mass index is 21.46 kg/m.  General appearance:  Normal affect, orientation and appearance. Skin: Grossly normal HEENT: Without gross lesions.  No cervical or supraclavicular adenopathy. Thyroid normal.  Lungs:  Clear without wheezing, rales or rhonchi Cardiac: RR, without RMG Abdominal:  Soft, nontender, without masses, guarding, rebound, organomegaly or hernia Breasts:  Examined lying and sitting without masses, retractions, discharge or axillary adenopathy.  Well-healed reduction scars noted Pelvic:  Ext, BUS, Vagina normal  Cervix normal. Pap smear done  Uterus anteverted, normal size, shape and contour, midline and mobile nontender   Adnexa without masses or tenderness    Anus and perineum normal   Rectovaginal normal sphincter tone without palpated masses or tenderness.    Assessment/Plan:  50 y.o. [id number] female for annual exam with regular menses, vasectomy birth control.   1. Dense breasts.  History of dense breasts. Had left breast cyst at last visit 10/2013. Follow up mammogram and ultrasound confirmed simple cyst. Due for mammogram now. Exam shows very dense breasts but no clearly palpable abnormalities on either side.  Going to call and order a diagnostic mammogram and have ultrasound available given her history of cysts and dense breasts. Also has family history of breast cancer in her mother and possible maternal aunt. We have discussed the  options for genetic counseling and she has declined. SBE monthly reviewed. 2. Pap smear 10/2012. Pap smear done today. No history of abnormal Pap smears previously. 3. Colonoscopy reported within 10 years with recommended repeat interval 10 years. Will follow up when due. 4. Health maintenance. Baseline CBC, CMP, lipid profile, TSH, urinalysis ordered. I reviewed what to expect in the perimenopausal time frame. She is having some hot flushes now but continues regular menses. Less frequent but regular menses will be monitored. Prolonged or atypical bleeding will be reported. If she goes more than 1 year without menses and then bleed she also knows to report this. If menopausal symptoms become significant will represent for discussion of treatment options.   Anastasio Auerbach MD, 4:16 PM 04/26/2016

## 2016-04-26 NOTE — Patient Instructions (Signed)
Mammogram facility will call you to schedule the mammogram. Call my office if you do not hear from them within 2 weeks.  You may obtain a copy of any labs that were done today by logging onto MyChart as outlined in the instructions provided with your AVS (after visit summary). The office will not call with normal lab results but certainly if there are any significant abnormalities then we will contact you.   Health Maintenance Adopting a healthy lifestyle and getting preventive care can go a long way to promote health and wellness. Talk with your health care provider about what schedule of regular examinations is right for you. This is a good chance for you to check in with your provider about disease prevention and staying healthy. In between checkups, there are plenty of things you can do on your own. Experts have done a lot of research about which lifestyle changes and preventive measures are most likely to keep you healthy. Ask your health care provider for more information. WEIGHT AND DIET  Eat a healthy diet  Be sure to include plenty of vegetables, fruits, low-fat dairy products, and lean protein.  Do not eat a lot of foods high in solid fats, added sugars, or salt.  Get regular exercise. This is one of the most important things you can do for your health.  Most adults should exercise for at least 150 minutes each week. The exercise should increase your heart rate and make you sweat (moderate-intensity exercise).  Most adults should also do strengthening exercises at least twice a week. This is in addition to the moderate-intensity exercise.  Maintain a healthy weight  Body mass index (BMI) is a measurement that can be used to identify possible weight problems. It estimates body fat based on height and weight. Your health care provider can help determine your BMI and help you achieve or maintain a healthy weight.  For females 52 years of age and older:   A BMI below 18.5 is  considered underweight.  A BMI of 18.5 to 24.9 is normal.  A BMI of 25 to 29.9 is considered overweight.  A BMI of 30 and above is considered obese.  Watch levels of cholesterol and blood lipids  You should start having your blood tested for lipids and cholesterol at 51 years of age, then have this test every 5 years.  You may need to have your cholesterol levels checked more often if:  Your lipid or cholesterol levels are high.  You are older than 51 years of age.  You are at high risk for heart disease.  CANCER SCREENING   Lung Cancer  Lung cancer screening is recommended for adults 53-50 years old who are at high risk for lung cancer because of a history of smoking.  A yearly low-dose CT scan of the lungs is recommended for people who:  Currently smoke.  Have quit within the past 15 years.  Have at least a 30-pack-year history of smoking. A pack year is smoking an average of one pack of cigarettes a day for 1 year.  Yearly screening should continue until it has been 15 years since you quit.  Yearly screening should stop if you develop a health problem that would prevent you from having lung cancer treatment.  Breast Cancer  Practice breast self-awareness. This means understanding how your breasts normally appear and feel.  It also means doing regular breast self-exams. Let your health care provider know about any changes, no matter how small.  If you are in your 20s or 30s, you should have a clinical breast exam (CBE) by a health care provider every 1-3 years as part of a regular health exam.  If you are 90 or older, have a CBE every year. Also consider having a breast X-ray (mammogram) every year.  If you have a family history of breast cancer, talk to your health care provider about genetic screening.  If you are at high risk for breast cancer, talk to your health care provider about having an MRI and a mammogram every year.  Breast cancer gene (BRCA)  assessment is recommended for women who have family members with BRCA-related cancers. BRCA-related cancers include:  Breast.  Ovarian.  Tubal.  Peritoneal cancers.  Results of the assessment will determine the need for genetic counseling and BRCA1 and BRCA2 testing. Cervical Cancer Routine pelvic examinations to screen for cervical cancer are no longer recommended for nonpregnant women who are considered low risk for cancer of the pelvic organs (ovaries, uterus, and vagina) and who do not have symptoms. A pelvic examination may be necessary if you have symptoms including those associated with pelvic infections. Ask your health care provider if a screening pelvic exam is right for you.   The Pap test is the screening test for cervical cancer for women who are considered at risk.  If you had a hysterectomy for a problem that was not cancer or a condition that could lead to cancer, then you no longer need Pap tests.  If you are older than 65 years, and you have had normal Pap tests for the past 10 years, you no longer need to have Pap tests.  If you have had past treatment for cervical cancer or a condition that could lead to cancer, you need Pap tests and screening for cancer for at least 20 years after your treatment.  If you no longer get a Pap test, assess your risk factors if they change (such as having a new sexual partner). This can affect whether you should start being screened again.  Some women have medical problems that increase their chance of getting cervical cancer. If this is the case for you, your health care provider may recommend more frequent screening and Pap tests.  The human papillomavirus (HPV) test is another test that may be used for cervical cancer screening. The HPV test looks for the virus that can cause cell changes in the cervix. The cells collected during the Pap test can be tested for HPV.  The HPV test can be used to screen women 42 years of age and older.  Getting tested for HPV can extend the interval between normal Pap tests from three to five years.  An HPV test also should be used to screen women of any age who have unclear Pap test results.  After 51 years of age, women should have HPV testing as often as Pap tests.  Colorectal Cancer  This type of cancer can be detected and often prevented.  Routine colorectal cancer screening usually begins at 51 years of age and continues through 51 years of age.  Your health care provider may recommend screening at an earlier age if you have risk factors for colon cancer.  Your health care provider may also recommend using home test kits to check for hidden blood in the stool.  A small camera at the end of a tube can be used to examine your colon directly (sigmoidoscopy or colonoscopy). This is done to check for  the earliest forms of colorectal cancer.  Routine screening usually begins at age 71.  Direct examination of the colon should be repeated every 5-10 years through 51 years of age. However, you may need to be screened more often if early forms of precancerous polyps or small growths are found. Skin Cancer  Check your skin from head to toe regularly.  Tell your health care provider about any new moles or changes in moles, especially if there is a change in a mole's shape or color.  Also tell your health care provider if you have a mole that is larger than the size of a pencil eraser.  Always use sunscreen. Apply sunscreen liberally and repeatedly throughout the day.  Protect yourself by wearing long sleeves, pants, a wide-brimmed hat, and sunglasses whenever you are outside. HEART DISEASE, DIABETES, AND HIGH BLOOD PRESSURE   Have your blood pressure checked at least every 1-2 years. High blood pressure causes heart disease and increases the risk of stroke.  If you are between 84 years and 71 years old, ask your health care provider if you should take aspirin to prevent  strokes.  Have regular diabetes screenings. This involves taking a blood sample to check your fasting blood sugar level.  If you are at a normal weight and have a low risk for diabetes, have this test once every three years after 50 years of age.  If you are overweight and have a high risk for diabetes, consider being tested at a younger age or more often. PREVENTING INFECTION  Hepatitis B  If you have a higher risk for hepatitis B, you should be screened for this virus. You are considered at high risk for hepatitis B if:  You were born in a country where hepatitis B is common. Ask your health care provider which countries are considered high risk.  Your parents were born in a high-risk country, and you have not been immunized against hepatitis B (hepatitis B vaccine).  You have HIV or AIDS.  You use needles to inject street drugs.  You live with someone who has hepatitis B.  You have had sex with someone who has hepatitis B.  You get hemodialysis treatment.  You take certain medicines for conditions, including cancer, organ transplantation, and autoimmune conditions. Hepatitis C  Blood testing is recommended for:  Everyone born from [birth year] through 1966.  Anyone with known risk factors for hepatitis C. Sexually transmitted infections (STIs)  You should be screened for sexually transmitted infections (STIs) including gonorrhea and chlamydia if:  You are sexually active and are younger than 51 years of age.  You are older than 51 years of age and your health care provider tells you that you are at risk for this type of infection.  Your sexual activity has changed since you were last screened and you are at an increased risk for chlamydia or gonorrhea. Ask your health care provider if you are at risk.  If you do not have HIV, but are at risk, it may be recommended that you take a prescription medicine daily to prevent HIV infection. This is called pre-exposure prophylaxis  (PrEP). You are considered at risk if:  You are sexually active and do not regularly use condoms or know the HIV status of your partner(s).  You take drugs by injection.  You are sexually active with a partner who has HIV. Talk with your health care provider about whether you are at high risk of being infected with HIV. If you  choose to begin PrEP, you should first be tested for HIV. You should then be tested every 3 months for as long as you are taking PrEP.  PREGNANCY   If you are premenopausal and you may become pregnant, ask your health care provider about preconception counseling.  If you may become pregnant, take 400 to 800 micrograms (mcg) of folic acid every day.  If you want to prevent pregnancy, talk to your health care provider about birth control (contraception). OSTEOPOROSIS AND MENOPAUSE   Osteoporosis is a disease in which the bones lose minerals and strength with aging. This can result in serious bone fractures. Your risk for osteoporosis can be identified using a bone density scan.  If you are 31 years of age or older, or if you are at risk for osteoporosis and fractures, ask your health care provider if you should be screened.  Ask your health care provider whether you should take a calcium or vitamin D supplement to lower your risk for osteoporosis.  Menopause may have certain physical symptoms and risks.  Hormone replacement therapy may reduce some of these symptoms and risks. Talk to your health care provider about whether hormone replacement therapy is right for you.  HOME CARE INSTRUCTIONS   Schedule regular health, dental, and eye exams.  Stay current with your immunizations.   Do not use any tobacco products including cigarettes, chewing tobacco, or electronic cigarettes.  If you are pregnant, do not drink alcohol.  If you are breastfeeding, limit how much and how often you drink alcohol.  Limit alcohol intake to no more than 1 drink per day for  nonpregnant women. One drink equals 12 ounces of beer, 5 ounces of wine, or 1 ounces of hard liquor.  Do not use street drugs.  Do not share needles.  Ask your health care provider for help if you need support or information about quitting drugs.  Tell your health care provider if you often feel depressed.  Tell your health care provider if you have ever been abused or do not feel safe at home. Document Released: 11/20/2010 Document Revised: 09/21/2013 Document Reviewed: 04/08/2013 [facility] Patient Information 2015 [place], [place]. This information is not intended to replace advice given to you by your health care provider. Make sure you discuss any questions you have with your health care provider.

## 2016-04-26 NOTE — Addendum Note (Signed)
Addended by: Nelva Nay on: 04/26/2016 04:26 PM   Modules accepted: Orders

## 2016-04-27 ENCOUNTER — Telehealth: Payer: Self-pay | Admitting: *Deleted

## 2016-04-27 DIAGNOSIS — N6002 Solitary cyst of left breast: Secondary | ICD-10-CM

## 2016-04-27 DIAGNOSIS — R922 Inconclusive mammogram: Secondary | ICD-10-CM

## 2016-04-27 LAB — PAP IG W/ RFLX HPV ASCU

## 2016-04-27 LAB — URINALYSIS W MICROSCOPIC + REFLEX CULTURE
Bacteria, UA: NONE SEEN [HPF]
Bilirubin Urine: NEGATIVE
CASTS: NONE SEEN [LPF]
Crystals: NONE SEEN [HPF]
Glucose, UA: NEGATIVE
HGB URINE DIPSTICK: NEGATIVE
Ketones, ur: NEGATIVE
Leukocytes, UA: NEGATIVE
Nitrite: NEGATIVE
Protein, ur: NEGATIVE
Specific Gravity, Urine: 1.007 (ref 1.001–1.035)
Yeast: NONE SEEN [HPF]
pH: 7 (ref 5.0–8.0)

## 2016-04-27 NOTE — Telephone Encounter (Signed)
-----   Message from Anastasio Auerbach, MD sent at 04/26/2016  4:20 PM EST ----- Schedule diagnostic mammography and ultrasound re: history of very dense breasts and prior multiple breast cysts. Due for mammography now.

## 2016-04-27 NOTE — Telephone Encounter (Signed)
Appointment scheduled on 05/08/16 @ 2:10pm at breast center, left message for pt to call.

## 2016-04-28 LAB — URINE CULTURE: ORGANISM ID, BACTERIA: NO GROWTH

## 2016-05-08 ENCOUNTER — Other Ambulatory Visit: Payer: No Typology Code available for payment source

## 2016-05-09 ENCOUNTER — Telehealth: Payer: Self-pay | Admitting: *Deleted

## 2016-05-09 NOTE — Telephone Encounter (Signed)
Pt informed with all normal lab results on 04/26/16 office visit.

## 2016-05-18 NOTE — Telephone Encounter (Signed)
Appointment rescheduled for 05/29/16 @ 3:10pm at breast center.

## 2016-05-29 ENCOUNTER — Ambulatory Visit
Admission: RE | Admit: 2016-05-29 | Discharge: 2016-05-29 | Disposition: A | Discharge status: Home / Self Care | Payer: BLUE CROSS/BLUE SHIELD | Source: Ambulatory Visit | Attending: Gynecology | Admitting: Gynecology

## 2016-05-29 DIAGNOSIS — R922 Inconclusive mammogram: Secondary | ICD-10-CM

## 2016-05-29 DIAGNOSIS — N6002 Solitary cyst of left breast: Secondary | ICD-10-CM

## 2017-04-06 IMAGING — MR MR KNEE*L* W/O CM
4 of 6 series · 19 of 40 positions shown · non-contrast
Comparison: None.

CLINICAL DATA: Left knee pain. Anterior medial knee pain for 2-1/2
years.

EXAM:
MRI OF THE LEFT KNEE WITHOUT CONTRAST
TECHNIQUE: Multiplanar, multisequence MR imaging of the knee was performed. No
intravenous contrast was administered.

[Series 3: PD fat-sat · axial · 3.5mm · 0.31mm/px · z∈[-56,+36]mm · 7 of 23 slices shown (1 of 4)]
[im 1/23]
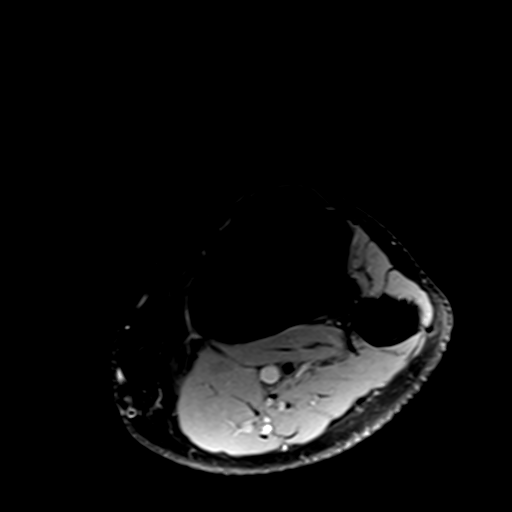
[im 4/23]
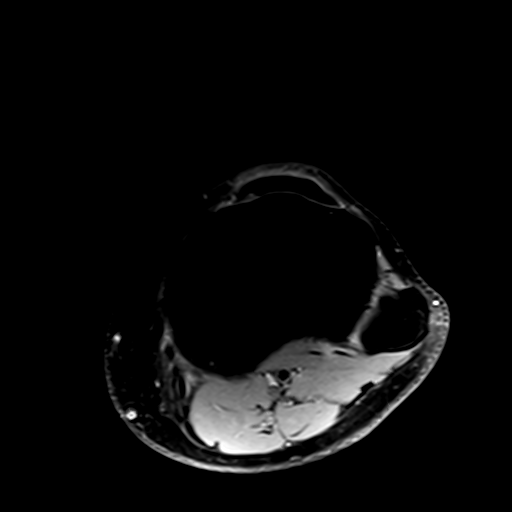
[im 8/23]
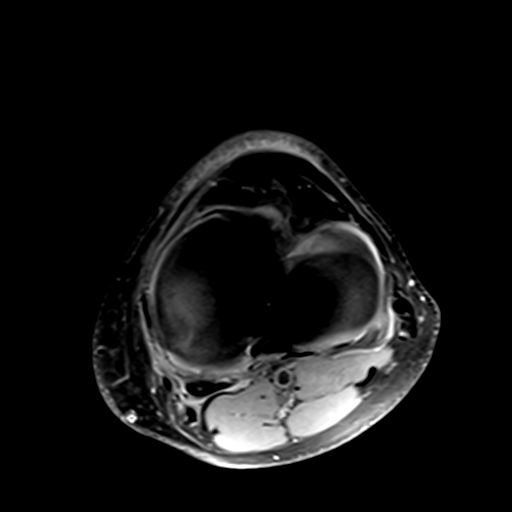
[im 12/23]
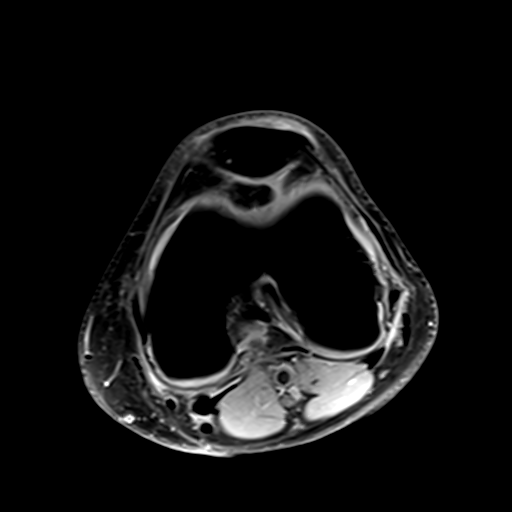
[im 15/23]
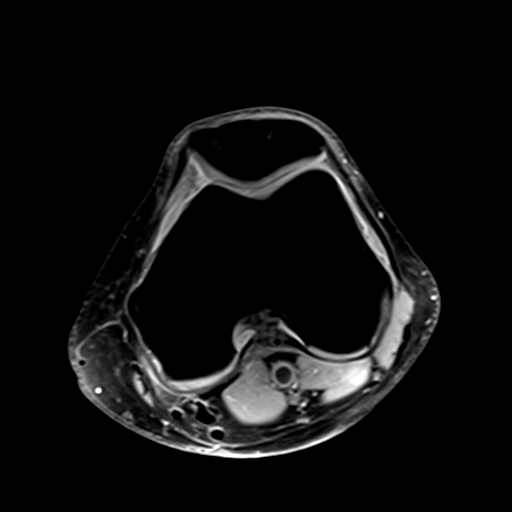
[im 19/23]
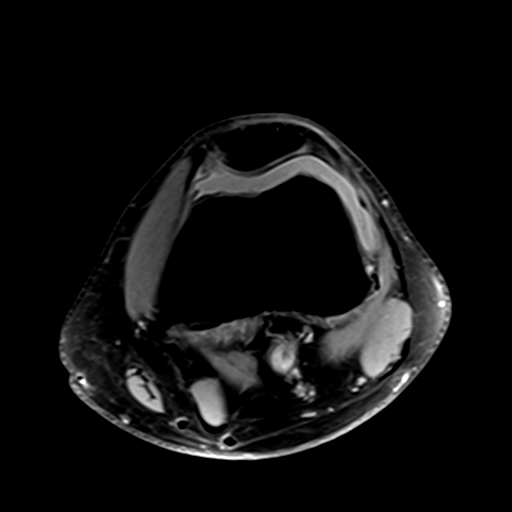
[im 23/23]
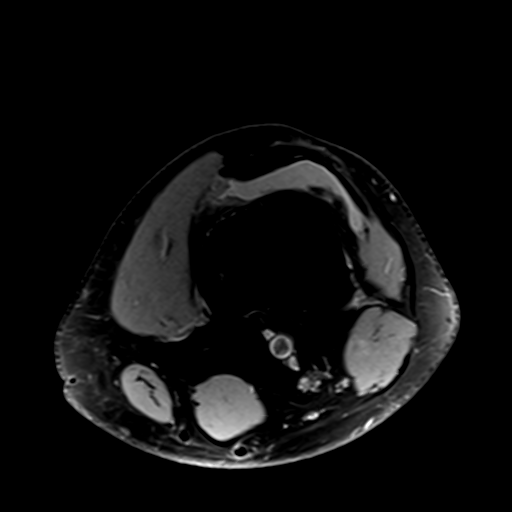

[Series 4: PD fat-sat · sagittal · 3.2mm · 0.29mm/px · 6 of 24 slices shown (2 of 4)]
[im 1/24]
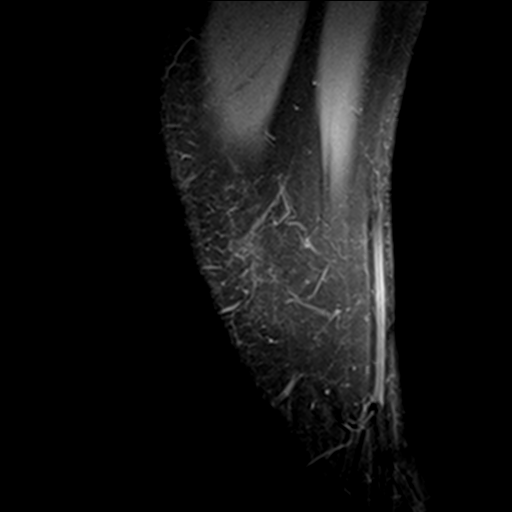
[im 4/24]
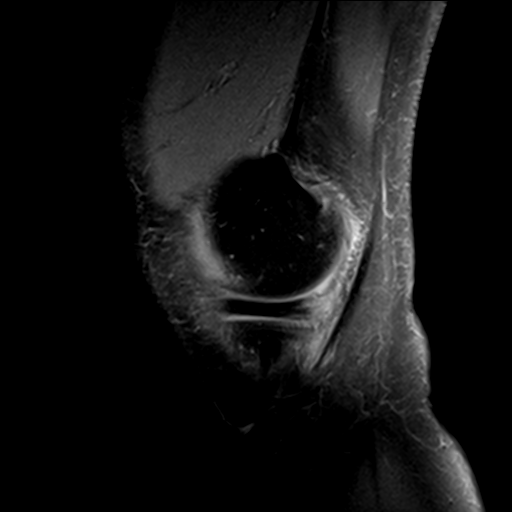
[im 7/24]
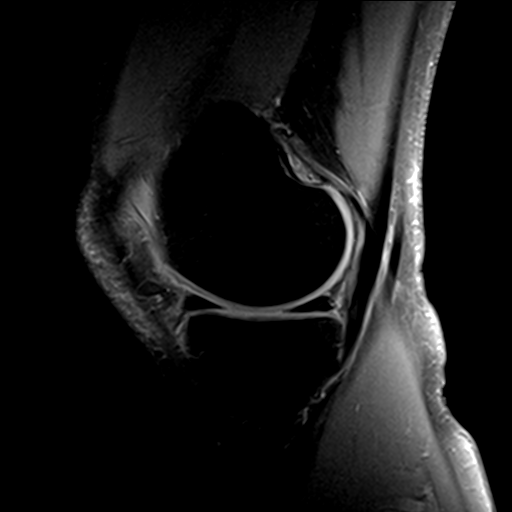
[im 10/24]
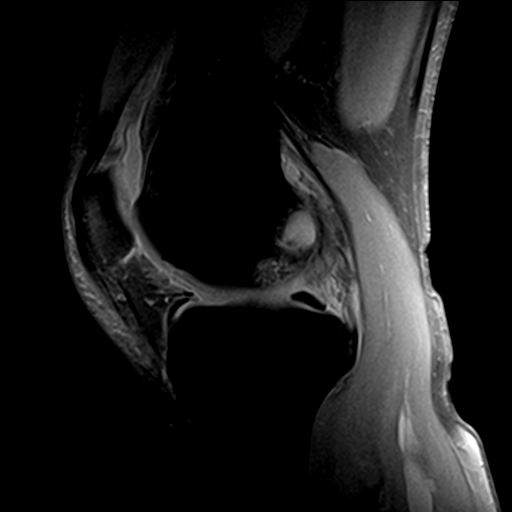
[im 14/24]
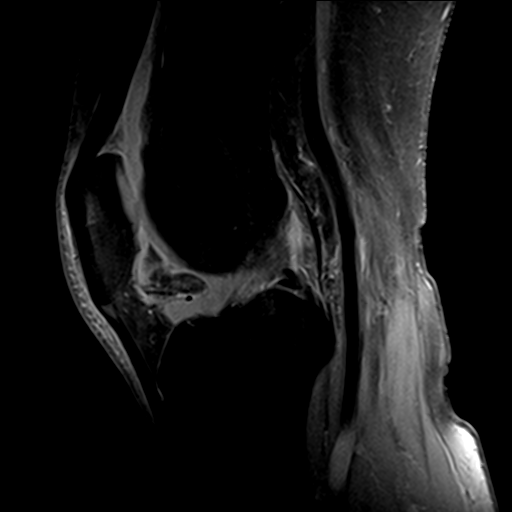
[im 20/24]
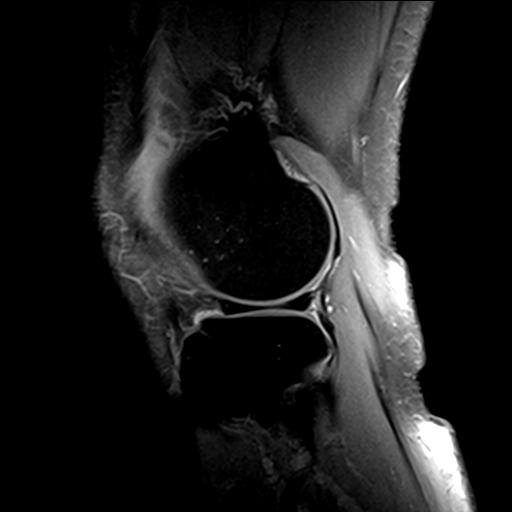

[Series 5: PD fat-sat · coronal · 3.5mm · 0.29mm/px · 3 of 22 slices shown (3 of 4)]
[im 4/22]
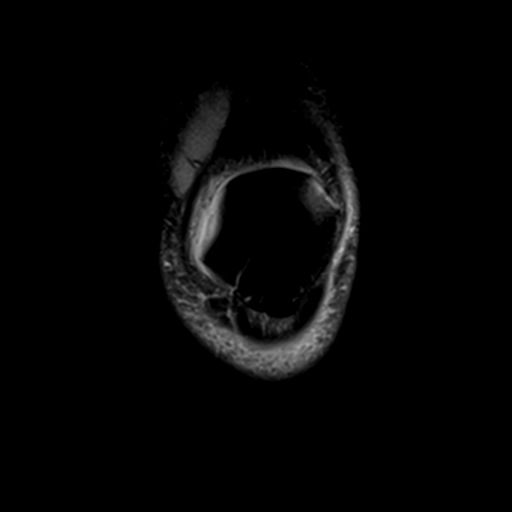
[im 11/22]
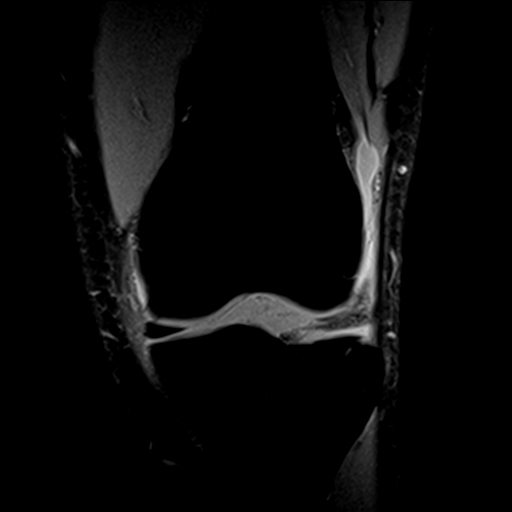
[im 18/22]
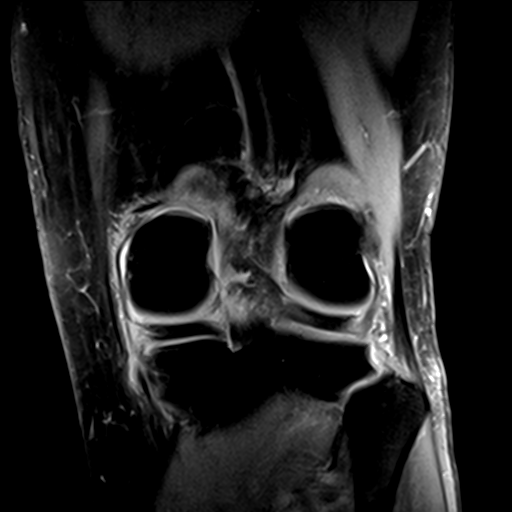

[Series 8: PD fat-sat · coronal · 2.0mm · 0.29mm/px · 3 of 11 slices shown (4 of 4)]
[im 1/11]
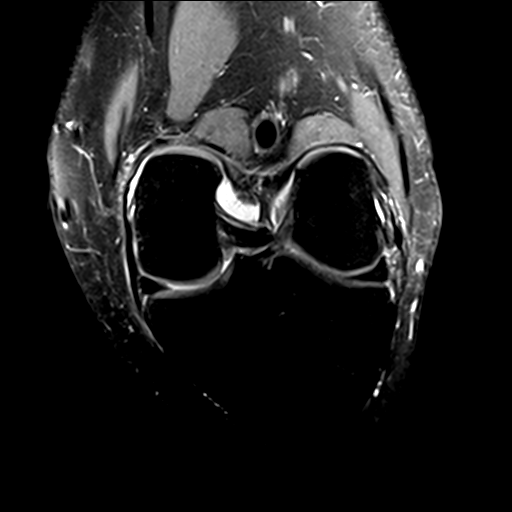
[im 7/11]
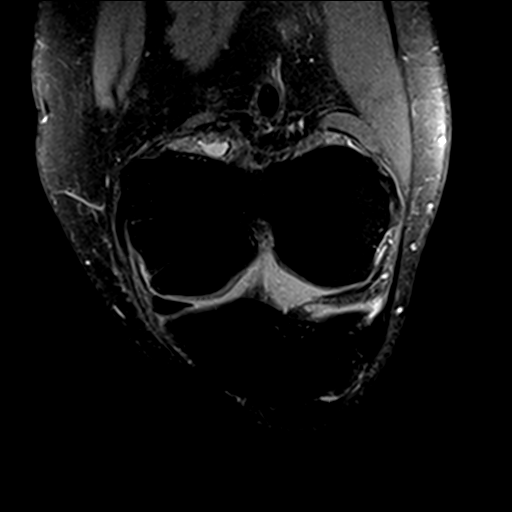
[im 11/11]
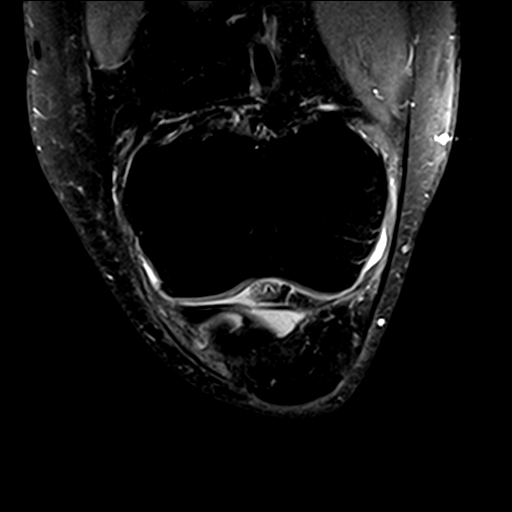

[19 of 40 positions shown; findings below may reference images not displayed]

FINDINGS: MENISCI

Medial meniscus: Oblique tear of the posterior horn of the medial
meniscus extending to the inferior articular surface.

Lateral meniscus:  Intact.

LIGAMENTS

Cruciates:  Intact ACL and PCL.

Collaterals: Medial collateral ligament is intact. Lateral
collateral ligament complex is intact.

CARTILAGE

Patellofemoral:  No chondral defect.

Medial: Partial thickness cartilage loss of the medial femoral
condyle and medial tibial plateau.

Lateral:  No chondral defect.

Joint: Moderate joint effusion. Mild edema and Hoffa's fat. No
plical thickening.

Popliteal Fossa: No significant Baker cyst. Intact popliteus tendon.

Extensor Mechanism: Mild tendinosis of the origin of the patellar
tendon. Intact quadriceps tendon.

Bones:  No marrow signal abnormality.  No fracture or dislocation.
IMPRESSION: 1. Oblique tear of the posterior horn of the medial meniscus
extending to the inferior articular surface.
2. Partial-thickness cartilage loss of the medial femoral condyle
and medial tibial plateau.
3. Moderate joint effusion.
4. Mild tendinosis of the origin of the patellar tendon.

## 2019-02-11 ENCOUNTER — Encounter: Payer: Self-pay | Admitting: Gynecology
